# Patient Record
Sex: Female | Born: 1970 | Race: White | Hispanic: No | Marital: Married | Smoking: Never smoker
Health system: Southern US, Community
[De-identification: ages and names within clinical notes are randomized; demographics above are authoritative.]

## PROBLEM LIST (undated history)

## (undated) DIAGNOSIS — E119 Type 2 diabetes mellitus without complications: Secondary | ICD-10-CM

## (undated) DIAGNOSIS — A419 Sepsis, unspecified organism: Secondary | ICD-10-CM

## (undated) DIAGNOSIS — T1491XA Suicide attempt, initial encounter: Secondary | ICD-10-CM

## (undated) DIAGNOSIS — I10 Essential (primary) hypertension: Secondary | ICD-10-CM

## (undated) DIAGNOSIS — E7800 Pure hypercholesterolemia, unspecified: Secondary | ICD-10-CM

## (undated) HISTORY — PX: COLONOSCOPY WITH PROPOFOL: SHX5780

## (undated) HISTORY — PX: TUBAL LIGATION: SHX77

---

## 2007-03-20 ENCOUNTER — Ambulatory Visit: Payer: Self-pay | Admitting: Internal Medicine

## 2007-10-16 ENCOUNTER — Ambulatory Visit: Payer: Self-pay | Admitting: Obstetrics and Gynecology

## 2011-03-10 ENCOUNTER — Ambulatory Visit: Payer: Self-pay | Admitting: Gastroenterology

## 2015-05-09 ENCOUNTER — Emergency Department: Payer: BLUE CROSS/BLUE SHIELD

## 2015-05-09 ENCOUNTER — Encounter: Payer: Self-pay | Admitting: Emergency Medicine

## 2015-05-09 DIAGNOSIS — T148 Other injury of unspecified body region: Secondary | ICD-10-CM | POA: Diagnosis not present

## 2015-05-09 DIAGNOSIS — Y9289 Other specified places as the place of occurrence of the external cause: Secondary | ICD-10-CM | POA: Diagnosis not present

## 2015-05-09 DIAGNOSIS — S8991XA Unspecified injury of right lower leg, initial encounter: Secondary | ICD-10-CM | POA: Diagnosis not present

## 2015-05-09 DIAGNOSIS — S8992XA Unspecified injury of left lower leg, initial encounter: Secondary | ICD-10-CM | POA: Insufficient documentation

## 2015-05-09 DIAGNOSIS — Y9301 Activity, walking, marching and hiking: Secondary | ICD-10-CM | POA: Diagnosis not present

## 2015-05-09 DIAGNOSIS — W108XXA Fall (on) (from) other stairs and steps, initial encounter: Secondary | ICD-10-CM | POA: Diagnosis not present

## 2015-05-09 DIAGNOSIS — Y998 Other external cause status: Secondary | ICD-10-CM | POA: Insufficient documentation

## 2015-05-09 NOTE — ED Notes (Addendum)
pt reports falling down 2 steps landing on both knees, reports bilat knee pain. Took extra strength tylenol at approx 2145

## 2015-05-10 ENCOUNTER — Emergency Department
Admission: EM | Admit: 2015-05-10 | Discharge: 2015-05-10 | Disposition: A | Discharge status: Home / Self Care | Payer: BLUE CROSS/BLUE SHIELD | Attending: Emergency Medicine | Admitting: Emergency Medicine

## 2015-05-10 DIAGNOSIS — M25561 Pain in right knee: Secondary | ICD-10-CM

## 2015-05-10 DIAGNOSIS — M25562 Pain in left knee: Secondary | ICD-10-CM

## 2015-05-10 DIAGNOSIS — T07XXXA Unspecified multiple injuries, initial encounter: Secondary | ICD-10-CM

## 2015-05-10 MED ORDER — OXYCODONE-ACETAMINOPHEN 5-325 MG PO TABS
2.0000 | ORAL_TABLET | Freq: Once | ORAL | Status: AC
  Administered 2015-05-10: 2 via ORAL
  Filled 2015-05-10: qty 2

## 2015-05-10 MED ORDER — OXYCODONE-ACETAMINOPHEN 5-325 MG PO TABS: 1.0000 | ORAL_TABLET | Freq: Four times a day (QID) | ORAL | Status: AC | PRN

## 2015-05-10 NOTE — Discharge Instructions (Signed)

## 2015-05-10 NOTE — ED Provider Notes (Signed)
[facility] Emergency Department Provider Note  Time seen: 2:34 AM  I have reviewed the triage vital signs and the nursing notes.   HISTORY  Chief Complaint Fall    HPI Cathy Lopez is a 44 y.o. female with no past medical history who presents the emergency department with bilateral knee pain. According to the patient she was walking down steps, she thought she was on the last episode had one more to go. She fell forwards landing on her bilateral knees. States immediate pain to both knees with mild swelling. Patient still able to ambulate well husband insisted that she come get checked out per patient. Denies hitting her head, denies loss of consciousness. Denies any other injuries. Describes her pain as moderate.Dull/aching, located in bilateral knees.     History reviewed. No pertinent past medical history.  There are no active problems to display for this patient.   Past Surgical History  Procedure Laterality Date  . Tubal ligation      No current outpatient prescriptions on file.  Allergies Review of patient's allergies indicates no known allergies.  History reviewed. No pertinent family history.  Social History Social History  Substance Use Topics  . Smoking status: Never Smoker   . Smokeless tobacco: None  . Alcohol Use: Yes     Comment: social     Review of Systems Constitutional: Negative for fever. Cardiovascular: Negative for chest pain. Respiratory: Negative for shortness of breath. Gastrointestinal: Negative for abdominal pain Musculoskeletal: Bilateral knee pain Neurological: Negative for headache 10-point ROS otherwise negative.  ____________________________________________   PHYSICAL EXAM:  VITAL SIGNS: ED Triage Vitals  Enc Vitals Group     BP 05/09/15 2251 143/74 mmHg     Pulse Rate 05/09/15 2251 96     Resp 05/09/15 2251 18     Temp 05/09/15 2251 97.7 F (36.5 C)     Temp Source 05/09/15 2251 Oral   SpO2 05/09/15 2251 100 %     Weight 05/09/15 2251 155 lb (70.308 kg)     Height 05/09/15 2251 5\' 2"  (1.575 m)     Head Cir --      Peak Flow --      Pain Score 05/09/15 2255 5     Pain Loc --      Pain Edu? --      Excl. in [place]? --     Constitutional: Alert and oriented. Well appearing and in no distress. Eyes: Normal exam ENT   Head: Normocephalic and atraumatic   Mouth/Throat: Mucous membranes are moist. Cardiovascular: Normal rate, regular rhythm. No murmur Respiratory: Normal respiratory effort without tachypnea nor retractions. Breath sounds are clear  Gastrointestinal: Soft and nontender. No distention.   Musculoskeletal: Bilateral knee tenderness to palpation. Neurovascular intact distally. Patient able to ambulate. Neurologic:  Normal speech and language. No gross focal neurologic deficits  Skin:  Skin is warm, dry and intact.  Psychiatric: Mood and affect are normal. Speech and behavior are normal.   ____________________________________________   RADIOLOGY  Bilateral knee x-rays negative  ____________________________________________    INITIAL IMPRESSION / ASSESSMENT AND PLAN / ED COURSE  Pertinent labs & imaging results that were available during my care of the patient were reviewed by me and considered in my medical decision making (see chart for details).  Patient with bilateral knee pain after falling on her knees. X-rays are negative we'll discharge the patient home with Percocet for pain control and follow-up with orthopedics if not better in 5-7  days. Patient agreeable to plan.  ____________________________________________   FINAL CLINICAL IMPRESSION(S) / ED DIAGNOSES  Contusions   Harvest Dark, MD 05/10/15 [phone]

## 2015-05-10 NOTE — ED Notes (Signed)
Pt sitting in chair in exam room with no distress noted; ace wraps in place to knees bilat. Pt reports PTA was walking down steps carrying presents, missing 1 step and fell on knees; denies any other c/o or injuries

## 2015-11-18 ENCOUNTER — Emergency Department
Admission: EM | Admit: 2015-11-18 | Discharge: 2015-11-19 | Disposition: A | Discharge status: Other Acute Inpatient Hospital | Payer: BLUE CROSS/BLUE SHIELD | Attending: Emergency Medicine | Admitting: Emergency Medicine

## 2015-11-18 ENCOUNTER — Emergency Department: Payer: BLUE CROSS/BLUE SHIELD

## 2015-11-18 ENCOUNTER — Encounter: Payer: Self-pay | Admitting: Emergency Medicine

## 2015-11-18 DIAGNOSIS — Z5181 Encounter for therapeutic drug level monitoring: Secondary | ICD-10-CM | POA: Insufficient documentation

## 2015-11-18 DIAGNOSIS — R4020 Unspecified coma: Secondary | ICD-10-CM | POA: Insufficient documentation

## 2015-11-18 DIAGNOSIS — T391X2A Poisoning by 4-Aminophenol derivatives, intentional self-harm, initial encounter: Secondary | ICD-10-CM | POA: Diagnosis present

## 2015-11-18 LAB — URINE DRUG SCREEN, QUALITATIVE (ARMC ONLY)
AMPHETAMINES, UR SCREEN: NOT DETECTED
BARBITURATES, UR SCREEN: NOT DETECTED
Benzodiazepine, Ur Scrn: NOT DETECTED
COCAINE METABOLITE, UR ~~LOC~~: NOT DETECTED
Cannabinoid 50 Ng, Ur ~~LOC~~: NOT DETECTED
MDMA (ECSTASY) UR SCREEN: NOT DETECTED
METHADONE SCREEN, URINE: NOT DETECTED
Opiate, Ur Screen: NOT DETECTED
Phencyclidine (PCP) Ur S: NOT DETECTED
Tricyclic, Ur Screen: NOT DETECTED

## 2015-11-18 LAB — BLOOD GAS, ARTERIAL
ACID-BASE DEFICIT: 27.6 mmol/L — AB (ref 0.0–2.0)
Allens test (pass/fail): POSITIVE — AB
BICARBONATE: 3.9 meq/L — AB (ref 21.0–28.0)
FIO2: 0.5
LHR: 16 {breaths}/min
MECHVT: 500 mL
Mechanical Rate: 16
O2 Saturation: 99.7 %
PATIENT TEMPERATURE: 37
PCO2 ART: 19 mmHg — AB (ref 32.0–48.0)
PEEP/CPAP: 5 cmH2O
PH ART: 6.92 — AB (ref 7.350–7.450)
PO2 ART: 285 mmHg — AB (ref 83.0–108.0)

## 2015-11-18 LAB — URINALYSIS COMPLETE WITH MICROSCOPIC (ARMC ONLY)
BILIRUBIN URINE: NEGATIVE
GLUCOSE, UA: NEGATIVE mg/dL
Hgb urine dipstick: NEGATIVE
LEUKOCYTES UA: NEGATIVE
NITRITE: NEGATIVE
PH: 5 (ref 5.0–8.0)
Protein, ur: 100 mg/dL — AB
Specific Gravity, Urine: 1.019 (ref 1.005–1.030)

## 2015-11-18 LAB — PROTIME-INR
INR: 2.03
Prothrombin Time: 22.8 seconds — ABNORMAL HIGH (ref 11.4–15.0)

## 2015-11-18 LAB — COMPREHENSIVE METABOLIC PANEL
ALBUMIN: 4 g/dL (ref 3.5–5.0)
ALK PHOS: 57 U/L (ref 38–126)
ALT: 197 U/L — AB (ref 14–54)
AST: 199 U/L — ABNORMAL HIGH (ref 15–41)
BUN: 19 mg/dL (ref 6–20)
CALCIUM: 9.2 mg/dL (ref 8.9–10.3)
CHLORIDE: 102 mmol/L (ref 101–111)
CREATININE: 1.8 mg/dL — AB (ref 0.44–1.00)
GFR calc Af Amer: 39 mL/min — ABNORMAL LOW (ref >60)
GFR calc non Af Amer: 33 mL/min — ABNORMAL LOW (ref >60)
GLUCOSE: 149 mg/dL — AB (ref 65–99)
Potassium: 3.6 mmol/L (ref 3.5–5.1)
SODIUM: 152 mmol/L — AB (ref 135–145)
Total Bilirubin: 2.4 mg/dL — ABNORMAL HIGH (ref 0.3–1.2)
Total Protein: 6.5 g/dL (ref 6.5–8.1)

## 2015-11-18 LAB — CBC
HCT: 51.9 % — ABNORMAL HIGH (ref 35.0–47.0)
HEMOGLOBIN: 15.8 g/dL (ref 12.0–16.0)
MCH: 30 pg (ref 26.0–34.0)
MCHC: 30.5 g/dL — ABNORMAL LOW (ref 32.0–36.0)
MCV: 98.1 fL (ref 80.0–100.0)
Platelets: 314 10*3/uL (ref 150–440)
RBC: 5.28 MIL/uL — AB (ref 3.80–5.20)
RDW: 14.7 % — ABNORMAL HIGH (ref 11.5–14.5)
WBC: 26.4 10*3/uL — AB (ref 3.6–11.0)

## 2015-11-18 LAB — LACTIC ACID, PLASMA: Lactic Acid, Venous: 17.1 mmol/L (ref 0.5–1.9)

## 2015-11-18 LAB — TROPONIN I: Troponin I: 0.13 ng/mL (ref <0.03)

## 2015-11-18 LAB — ETHANOL: Alcohol, Ethyl (B): <5 mg/dL (ref <5)

## 2015-11-18 LAB — ACETAMINOPHEN LEVEL

## 2015-11-18 LAB — SALICYLATE LEVEL: Salicylate Lvl: <4 mg/dL (ref 2.8–30.0)

## 2015-11-18 MED ORDER — SODIUM BICARBONATE 8.4 % IV SOLN
50.0000 meq | Freq: Once | INTRAVENOUS | Status: AC
  Administered 2015-11-18: 50 meq via INTRAVENOUS

## 2015-11-18 MED ORDER — ETOMIDATE 2 MG/ML IV SOLN: 20.0000 mg | Freq: Once | INTRAVENOUS | Status: DC

## 2015-11-18 MED ORDER — SUCCINYLCHOLINE CHLORIDE 20 MG/ML IJ SOLN
INTRAMUSCULAR | Status: AC | PRN
  Administered 2015-11-18: 100 mg via INTRAVENOUS

## 2015-11-18 MED ORDER — NOREPINEPHRINE 4 MG/250ML-% IV SOLN
INTRAVENOUS | Status: AC
  Filled 2015-11-18: qty 250

## 2015-11-18 MED ORDER — DEXTROSE 5 % IV SOLN
15.0000 mg/kg/h | INTRAVENOUS | Status: DC
  Administered 2015-11-18: 15 mg/kg/h via INTRAVENOUS
  Filled 2015-11-18: qty 200

## 2015-11-18 MED ORDER — VASOPRESSIN 20 UNIT/ML IV SOLN
0.0300 [IU]/min | INTRAVENOUS | Status: DC
  Administered 2015-11-19: 0.03 [IU]/min via INTRAVENOUS
  Filled 2015-11-18: qty 2

## 2015-11-18 MED ORDER — PIPERACILLIN-TAZOBACTAM 3.375 G IVPB 30 MIN
3.3750 g | Freq: Once | INTRAVENOUS | Status: AC
Start: 2015-11-18 — End: 2015-11-19
  Administered 2015-11-19: 3.375 g via INTRAVENOUS
  Filled 2015-11-18: qty 50

## 2015-11-18 MED ORDER — NOREPINEPHRINE 4 MG/250ML-% IV SOLN
0.0000 ug/min | INTRAVENOUS | Status: DC
  Administered 2015-11-19: 24 ug/min via INTRAVENOUS
  Filled 2015-11-18: qty 250

## 2015-11-18 MED ORDER — SODIUM BICARBONATE 8.4 % IV SOLN
50.0000 meq | Freq: Once | INTRAVENOUS | Status: AC
  Administered 2015-11-18: 50 meq via INTRAVENOUS
  Filled 2015-11-18: qty 50

## 2015-11-18 MED ORDER — ACETYLCYSTEINE LOAD VIA INFUSION
150.0000 mg/kg | Freq: Once | INTRAVENOUS | Status: DC
Start: 2015-11-18 — End: 2015-11-18
  Filled 2015-11-18 (×2): qty 330

## 2015-11-18 MED ORDER — SODIUM CHLORIDE 0.9 % IV BOLUS (SEPSIS)
1000.0000 mL | Freq: Once | INTRAVENOUS | Status: AC
  Administered 2015-11-18: 1000 mL via INTRAVENOUS

## 2015-11-18 MED ORDER — PROPOFOL 1000 MG/100ML IV EMUL
INTRAVENOUS | Status: AC | PRN
  Administered 2015-11-18: 9.48 ug/kg/min via INTRAVENOUS

## 2015-11-18 MED ORDER — PROPOFOL 1000 MG/100ML IV EMUL
INTRAVENOUS | Status: AC
  Filled 2015-11-18: qty 100

## 2015-11-18 MED ORDER — ETOMIDATE 2 MG/ML IV SOLN
INTRAVENOUS | Status: AC | PRN
  Administered 2015-11-18: 20 mg via INTRAVENOUS

## 2015-11-18 MED ORDER — SODIUM BICARBONATE 8.4 % IV SOLN
INTRAVENOUS | Status: AC
  Administered 2015-11-18: 50 meq via INTRAVENOUS
  Filled 2015-11-18: qty 50

## 2015-11-18 MED ORDER — NOREPINEPHRINE BITARTRATE 1 MG/ML IV SOLN
10.0000 ug/min | Freq: Once | INTRAVENOUS | Status: DC
  Administered 2015-11-18: 10 ug/min via INTRAVENOUS

## 2015-11-18 MED ORDER — VANCOMYCIN HCL IN DEXTROSE 1-5 GM/200ML-% IV SOLN
1000.0000 mg | Freq: Once | INTRAVENOUS | Status: AC
  Administered 2015-11-19: 1000 mg via INTRAVENOUS
  Filled 2015-11-18: qty 200

## 2015-11-18 MED ORDER — PROPOFOL 1000 MG/100ML IV EMUL
5.0000 ug/kg/min | INTRAVENOUS | Status: DC
  Administered 2015-11-18: 9.48 ug/kg/min via INTRAVENOUS
  Filled 2015-11-18: qty 100

## 2015-11-18 MED ORDER — ACETYLCYSTEINE LOAD VIA INFUSION
150.0000 mg/kg | Freq: Once | INTRAVENOUS | Status: AC
  Administered 2015-11-18: 13185 mg via INTRAVENOUS
  Filled 2015-11-18: qty 330

## 2015-11-18 MED ORDER — SODIUM BICARBONATE 8.4 % IV SOLN
INTRAVENOUS | Status: DC
  Administered 2015-11-18: 21:00:00 via INTRAVENOUS
  Filled 2015-11-18 (×4): qty 850

## 2015-11-18 MED ORDER — CEFTRIAXONE SODIUM 1 G IJ SOLR
1.0000 g | Freq: Once | INTRAMUSCULAR | Status: AC
  Administered 2015-11-18: 1 g via INTRAVENOUS
  Filled 2015-11-18: qty 10

## 2015-11-18 MED ORDER — SUCCINYLCHOLINE CHLORIDE 20 MG/ML IJ SOLN: 100.0000 mg | Freq: Once | INTRAMUSCULAR | Status: DC

## 2015-11-18 NOTE — ED Notes (Addendum)
Patients BP slowly decreasing, MD notified and gave verbal order to decrease propofol to 2.5 ml/hr.  See [facility]

## 2015-11-18 NOTE — Progress Notes (Addendum)
ETT pulled 1cm from 23cm to 22cm

## 2015-11-18 NOTE — ED Notes (Addendum)
Pt comes into the ED via EMS as emergency traffic for intentional overdose on tylenol.  Patient found by husband in the bathtub with a note and an empty bottle of tylenol (100 tablets at 500 mg a tablet only 1 tablet left in the bottle when found)  next to her.  Given 2 of narcan in route and 500 ml bolus.  No past medical history but patient's family stated that she has been feeling ill lately.  No history of SI.  EMS was called by family at 17:49 but patient was down for unknown amount of time.

## 2015-11-18 NOTE — ED Provider Notes (Addendum)
[facility] Emergency Department Provider Note  Time seen: 6:51 PM  I have reviewed the triage vital signs and the nursing notes.   HISTORY  Chief Complaint Drug Overdose    HPI Cathy Lopez is a 45 y.o. female with no known past medical history who presents the emergency department unresponsive by EMS. According to EMS the husband of the patient returned home to find her in the bathtub unresponsive with a suicide note and an empty bottle of Tylenol. It is not clear what time the patient took the pills. Upon arrival to the emergency department and the patient is moaning, unresponsive to commands, no reaction to sternal rub. Patient has dried vomitus on her face. Patient unable to answer questions. Patient's eyes are open, but she does not make eye contact. Hypotensive 90/50 upon arrival.     History reviewed. No pertinent past medical history.  There are no active problems to display for this patient.   Past Surgical History  Procedure Laterality Date  . Tubal ligation      Current Outpatient Rx  Name  Route  Sig  Dispense  Refill  . oxyCODONE-acetaminophen (ROXICET) 5-325 MG tablet   Oral   Take 1 tablet by mouth every 6 (six) hours as needed.   12 tablet   0     Allergies Review of patient's allergies indicates no known allergies.  No family history on file.  Social History Social History  Substance Use Topics  . Smoking status: Never Smoker   . Smokeless tobacco: None  . Alcohol Use: Yes     Comment: social     Review of Systems Unable to obtain a review of systems secondary to unresponsiveness. ____________________________________________   PHYSICAL EXAM:  VITAL SIGNS: ED Triage Vitals  Enc Vitals Group     BP 11/18/15 1836 90/50 mmHg     Pulse Rate 11/18/15 1834 83     Resp 11/18/15 1834 22     Temp --      Temp src --      SpO2 11/18/15 1834 97 %     Weight 11/18/15 1834 193 lb 12.6 oz (87.9 kg)     Height --       Head Cir --      Peak Flow --      Pain Score --      Pain Loc --      Pain Edu? --      Excl. in [place]? --     Constitutional:Eyes are open, but she does not make eye contact, moaning, but no reaction to sternal rub. Dried vomitus on face. Eyes: 3-4 millimeters, sluggishly reactive bilaterally. ENT   Head: Normocephalic and atraumatic.   Mouth/Throat: Dry mucous membranes with dried vomitus on face. Cardiovascular: Normal rate, regular rhythm. No murmur Respiratory: Normal respiratory effort without tachypnea nor retractions. Breath sounds are clear  Gastrointestinal: No distention. Soft. Musculoskeletal: Atraumatic appearing extremities. No lower extremity edema. Neurologic:  Unresponsive to painful stimuli. Skin:  Skin is warm, dry  Psychiatric: Unresponsive.  ____________________________________________    EKG  EKG reviewed and interpreted by myself shows normal sinus rhythm at 83 bpm, slightly widened QRS, normal axis, prolonged QTc interval 592. Nonspecific ST changes.  ____________________________________________    RADIOLOGY  Chest x-ray negative.   INITIAL IMPRESSION / ASSESSMENT AND PLAN / ED COURSE  Pertinent labs & imaging results that were available during my care of the patient were reviewed by me and considered in my  medical decision making (see chart for details).  Patient presents the emergency department largely unresponsive, no reaction to painful stimuli with dried vomitus on face. At that time decision was made to intubate for airway protection. Intubation performed. Patient found with a suicide note per EMS and an empty bottle of Tylenol. Does not clear with time the patient took the Tylenol, or the patient took any additional substances. We will IV hydrate as the patient is currently hypotensive 90/50. Labs have been sent. I discussed with pharmacy and they will be sending acetylcysteine for IV dosing. ----------------------------------------- 8:54  PM on 11/18/2015 -----------------------------------------  Patient's labs have resulted with an elevated acetaminophen level greater than 500. INR 2.03, leukocytosis of 26,000, ALT of 197. Troponin 0.13. Initially the ALT was under manual dilution, I spoke to the lab regarding thisand that means it will be significantly elevated. It is finally resulted but only at 197. Patient does have a pH of 6.7. Patient has been dosed an amp of sodium bicarbonate in it now on a sodium bicarbonate drip. Acetaminophen greater and 500 patient is on acetylcysteine drip. Patient's blood pressure remains extremely low currently 74 systolic. I placed a right femoral central line and started patient on norepinephrine. Patient is currently on propofol at 2 mcg/kg/m with adequate sedation. Patient has been accepted to [facility] for transfer to the ICU given the likelihood of liver failure with a significantly elevated acetaminophen level is significantly lower pH. I discussed this with our hospitalist who felt the patient would be better cared for at a Center capable of performing a liver transplant if deemed necessary. Currently awaiting bed availability at [facility] ICU.  I have canceled the patient's involuntary commitment so she can be transferred via EMS. Patient is currently intubated and sedated and does not pose a threat to herself or anyone else in her current state.  ----------------------------------------- 10:16 PM on 11/18/2015 -----------------------------------------  Repeat ABG shows a pH of 6.85. We will dose of an additional amp of sodium bicarbonate, increased a sodium bicarbonate drip to 250 cc per hour.   ----------------------------------------- 10:51 PM on 11/18/2015 -----------------------------------------  Patient has received a bed at [facility] intensive care unit, we are currently arranging transportation. Patient's blood pressure currently 91/47 on levothyroid. Patient is on a bicarbonate drip as  well as acetylcysteine drip. Patient has a triple lumen right femoral catheter.  CT head is negative. Lactate has resulted at 17.  ----------------------------------------- 11:25 PM on 11/18/2015 -----------------------------------------  [facility] intensive is updated on patient condition, and lab results. Given her elevated lactic acidosis continued hypotension [facility] intensive this would like the patient started on vasopressin, blood cultures sent and started on IV antibiotics. We will start the patient on these medications while awaiting transport to [facility].     INTUBATION Performed by: Harvest Dark  Required items: required blood products, implants, devices, and special equipment available Patient identity confirmed: provided demographic data and hospital-assigned identification number Time out: Immediately prior to procedure a "time out" was called to verify the correct patient, procedure, equipment, support staff and site/side marked as required.  Indications: airway protection  Intubation method: 4.0 Glidescope Laryngoscopy   Preoxygenation: 100% O2 BVM  Sedatives: 20mg  Etomidate Paralytic: 100mg  Succinylcholine  Tube Size: 7.5 cuffed  Post-procedure assessment: chest rise and ETCO2 monitor Breath sounds: equal and absent over the epigastrium Tube secured with: ETT holder Chest x-ray interpreted by radiologist and me.  Chest x-ray findings: endotracheal tube in appropriate position  Patient tolerated the procedure well with no  immediate complications.  CENTRAL LINE Performed by: Harvest Dark Consent: The procedure was performed in an emergent situation. Required items: required blood products, implants, devices, and special equipment available Patient identity confirmed: arm band and provided demographic data Time out: Immediately prior to procedure a "time out" was called to verify the correct patient, procedure, equipment, support staff and site/side marked as  required. Indications: vascular access Anesthesia: local infiltration Local anesthetic: lidocaine 1% with epinephrine Anesthetic total: 3 ml Patient sedated: no Preparation: skin prepped with 2% chlorhexidine Skin prep agent dried: skin prep agent completely dried prior to procedure Sterile barriers: all five maximum sterile barriers used - cap, mask, sterile gown, sterile gloves, and large sterile sheet Hand hygiene: hand hygiene performed prior to central venous catheter insertion  Location details: right femoral  Catheter type: triple lumen Catheter size: 8 Fr Pre-procedure: landmarks identified Ultrasound guidance: no Successful placement: yes Post-procedure: line sutured and dressing applied Assessment: blood return through all parts, free fluid flow Patient tolerance: Patient tolerated the procedure well with no immediate complications.    CRITICAL CARE Performed by: Harvest Dark   Total critical care time: 120 minutes  Critical care time was exclusive of separately billable procedures and treating other patients.  Critical care was necessary to treat or prevent imminent or life-threatening deterioration.  Critical care was time spent personally by me on the following activities: development of treatment plan with patient and/or surrogate as well as nursing, discussions with consultants, evaluation of patient's response to treatment, examination of patient, obtaining history from patient or surrogate, ordering and performing treatments and interventions, ordering and review of laboratory studies, ordering and review of radiographic studies, pulse oximetry and re-evaluation of patient's condition.    ____________________________________________   FINAL CLINICAL IMPRESSION(S) / ED DIAGNOSES  Unresponsiveness Overdose   Harvest Dark, MD 11/18/15 2252  Harvest Dark, MD 11/18/15 2252  Harvest Dark, MD 11/18/15 2325  Harvest Dark,  MD 11/18/15 2329

## 2015-11-18 NOTE — Code Documentation (Signed)
Radiology at bedside for chest x ray

## 2015-11-18 NOTE — Progress Notes (Signed)
MEDICATION RELATED CONSULT NOTE - INITIAL   Pharmacy Consult for acetaminophen overdose treatment using N-Acetylcysteine (NAC) Indication: acetaminophen overdose  No Known Allergies  Patient Measurements: Weight: 193 lb 12.6 oz (87.9 kg)   Vital Signs: BP: 106/57 mmHg (07/06 1915) Pulse Rate: 79 (07/06 1915) Intake/Output from previous day:   Intake/Output from this shift:    Labs:  Recent Labs  11/18/15 1839  WBC 26.4*  HGB 15.8  HCT 51.9*  PLT 314   CrCl cannot be calculated (Unknown ideal weight.).  Assessment: Patient found unresponsive by husband with empty bottle of tylenol and suicide note. Unknown time of ingestion. Contacted by MD for IV NAC Discussed with [facility], recommended to start IV NAC now due to unknown time of ingestion.   Plan:  Baseline labs ordered. Will order NAC load and infusion. Recheck labs 22 hours into maintenance therapy and follow up with poison control.   Cathy Lopez C 11/18/2015,7:32 PM     [place]

## 2015-11-18 NOTE — ED Notes (Signed)
MD at bedside with family

## 2015-11-18 NOTE — ED Notes (Addendum)
Poison control called to be updated on patients care, lab work, and vital signs.  Spoke with Colletta Maryland with [employer] for all updated information.

## 2015-11-18 NOTE — Progress Notes (Signed)
Pharmacy Progress note  IV acetylcysteine started at 2006. Follow up labs (AST/ALT, INR, BMET, acetaminophen level) ordered for 22h after start of drip for 7/7 (tomorrow) at 1800.

## 2015-11-18 NOTE — ED Notes (Signed)
[place] with poison control called to give update on patient.  EKG information given to [facility] and recommendations include: draw magnesium and potassium levels then supplement to high-normal levels and repeat the EKG after this has been done.

## 2015-11-18 NOTE — ED Notes (Signed)
Per MD verbal order, bump sodium bicarb to 250mg /hr and give another amp of sodium bicarb.  See [facility]

## 2015-11-18 NOTE — Progress Notes (Signed)
Transported pt to CT and back to ED on the vent without incident.

## 2015-11-18 NOTE — ED Notes (Signed)
Pharmacy called poison control

## 2015-11-18 NOTE — ED Notes (Signed)
Patients husband is at bedside with patient.  Given update on care of patient.

## 2015-11-19 NOTE — ED Notes (Signed)
1 bottle of propofol sent with air care.

## 2015-11-20 LAB — URINE CULTURE: Culture: 1000 — AB

## 2015-11-24 LAB — CULTURE, BLOOD (ROUTINE X 2)
CULTURE: NO GROWTH
CULTURE: NO GROWTH

## 2015-11-25 LAB — BLOOD GAS, ARTERIAL
ALLENS TEST (PASS/FAIL): POSITIVE — AB
Allens test (pass/fail): POSITIVE — AB
FIO2: 1
FIO2: 0.5
Mechanical Rate: 16
Mechanical Rate: 16
PATIENT TEMPERATURE: 37
PEEP: 5 cmH2O
PEEP: 5 cmH2O
Patient temperature: 37
RATE: 16 resp/min
RATE: 16 resp/min
VT: 500 mL
VT: 500 mL
pCO2 arterial: 24 mmHg — ABNORMAL LOW (ref 32.0–48.0)
pCO2 arterial: 17 mmHg — CL (ref 32.0–48.0)
pH, Arterial: 6.7 — CL (ref 7.350–7.450)
pH, Arterial: 6.85 — CL (ref 7.350–7.450)
pO2, Arterial: >552 mmHg — ABNORMAL HIGH (ref 83.0–108.0)
pO2, Arterial: 288 mmHg — ABNORMAL HIGH (ref 83.0–108.0)

## 2016-10-25 ENCOUNTER — Emergency Department: Payer: BLUE CROSS/BLUE SHIELD

## 2016-10-25 ENCOUNTER — Encounter: Payer: Self-pay | Admitting: *Deleted

## 2016-10-25 ENCOUNTER — Emergency Department
Admission: EM | Admit: 2016-10-25 | Discharge: 2016-10-25 | Disposition: A | Discharge status: Home / Self Care | Payer: BLUE CROSS/BLUE SHIELD | Attending: Emergency Medicine | Admitting: Emergency Medicine

## 2016-10-25 DIAGNOSIS — R079 Chest pain, unspecified: Secondary | ICD-10-CM

## 2016-10-25 DIAGNOSIS — Z8659 Personal history of other mental and behavioral disorders: Secondary | ICD-10-CM | POA: Diagnosis not present

## 2016-10-25 DIAGNOSIS — R1111 Vomiting without nausea: Secondary | ICD-10-CM | POA: Diagnosis not present

## 2016-10-25 DIAGNOSIS — R0602 Shortness of breath: Secondary | ICD-10-CM | POA: Insufficient documentation

## 2016-10-25 DIAGNOSIS — R61 Generalized hyperhidrosis: Secondary | ICD-10-CM | POA: Diagnosis not present

## 2016-10-25 DIAGNOSIS — R0789 Other chest pain: Secondary | ICD-10-CM | POA: Diagnosis present

## 2016-10-25 HISTORY — DX: Suicide attempt, initial encounter: T14.91XA

## 2016-10-25 LAB — BASIC METABOLIC PANEL
ANION GAP: 8 (ref 5–15)
BUN: 12 mg/dL (ref 6–20)
CHLORIDE: 104 mmol/L (ref 101–111)
CO2: 24 mmol/L (ref 22–32)
Calcium: 9.6 mg/dL (ref 8.9–10.3)
Creatinine, Ser: 0.65 mg/dL (ref 0.44–1.00)
GFR calc Af Amer: >60 mL/min (ref >60)
GLUCOSE: 102 mg/dL — AB (ref 65–99)
POTASSIUM: 4.9 mmol/L (ref 3.5–5.1)
Sodium: 136 mmol/L (ref 135–145)

## 2016-10-25 LAB — CBC
HEMATOCRIT: 44.7 % (ref 35.0–47.0)
HEMOGLOBIN: 15.4 g/dL (ref 12.0–16.0)
MCH: 29.4 pg (ref 26.0–34.0)
MCHC: 34.4 g/dL (ref 32.0–36.0)
MCV: 85.6 fL (ref 80.0–100.0)
Platelets: 264 10*3/uL (ref 150–440)
RBC: 5.22 MIL/uL — ABNORMAL HIGH (ref 3.80–5.20)
RDW: 13.7 % (ref 11.5–14.5)
WBC: 9.3 10*3/uL (ref 3.6–11.0)

## 2016-10-25 LAB — POCT PREGNANCY, URINE: Preg Test, Ur: NEGATIVE

## 2016-10-25 LAB — HEPATIC FUNCTION PANEL
ALBUMIN: 4.3 g/dL (ref 3.5–5.0)
ALT: 20 U/L (ref 14–54)
AST: 15 U/L (ref 15–41)
Alkaline Phosphatase: 50 U/L (ref 38–126)
BILIRUBIN TOTAL: 0.5 mg/dL (ref 0.3–1.2)
Total Protein: 7.3 g/dL (ref 6.5–8.1)

## 2016-10-25 LAB — TROPONIN I: Troponin I: <0.03 ng/mL (ref <0.03)

## 2016-10-25 MED ORDER — ASPIRIN 81 MG PO CHEW
324.0000 mg | CHEWABLE_TABLET | Freq: Once | ORAL | Status: AC
  Administered 2016-10-25: 324 mg via ORAL
  Filled 2016-10-25: qty 4

## 2016-10-25 NOTE — ED Provider Notes (Signed)
[facility] Emergency Department Provider Note  ____________________________________________  Time seen: Approximately 12:39 PM  I have reviewed the triage vital signs and the nursing notes.   HISTORY  Chief Complaint Chest Pain   HPI Cathy Lopez is a 46 y.o. female with a history of depression and prior suicide attempt a year ago by Tylenol overdose who presents for evaluation of chest pain. Patient reports that she started having central chest heaviness that began at 2 AM. The episodes last a few minutes at a time and were associated with nausea, a few episodes of nonbloody nonbilious emesis, shortness of breath, and diaphoresis. Patient has never had similar episodes in the past. No history of smoking or drug use. She has family history and her mother of a heart attack. Patient has no pain at this time. Her last episode was at 10 AM which prompted her visit to the emergency room. No abdominal pain, no cough or congestion, no shortness of breath or chest pain at this time, no fever or chills, no hematuria, no diarrhea or constipation. Patient has never seen a cardiologist or had a stress test in her life. No personal or family history of blood clots, no recent travel or immobilization, no leg pain or swelling, no hemoptysis, no hormone use.  Past Medical History:  Diagnosis Date  . Suicide attempt ([place])     There are no active problems to display for this patient.   Past Surgical History:  Procedure Laterality Date  . TUBAL LIGATION      Prior to Admission medications   Medication Sig Start Date End Date Taking? Authorizing Provider  Melatonin 5 MG TABS Take 5 mg by mouth at bedtime as needed.   Yes [provider]  Multiple Vitamin (MULTIVITAMIN) tablet Take 1 tablet by mouth daily.   Yes [provider]  sertraline (ZOLOFT) 50 MG tablet Take 50 mg by mouth daily. 09/07/16  Yes [provider]  oxyCODONE-acetaminophen  (ROXICET) 5-325 MG tablet Take 1 tablet by mouth every 6 (six) hours as needed. Patient not taking: Reported on 10/25/2016 05/10/15   Harvest Dark, MD    Allergies Patient has no known allergies.  Family History Mother - heart attack  Social History Social History  Substance Use Topics  . Smoking status: Never Smoker  . Smokeless tobacco: Never Used  . Alcohol use Yes     Comment: social     Review of Systems  Constitutional: Negative for fever. Eyes: Negative for visual changes. ENT: Negative for sore throat. Neck: No neck pain  Cardiovascular: +chest pain, diaphoresis Respiratory: Negative for shortness of breath. Gastrointestinal: Negative for abdominal pain,  Diarrhea. + N/V Genitourinary: Negative for dysuria. Musculoskeletal: Negative for back pain. Skin: Negative for rash. Neurological: Negative for headaches, weakness or numbness. Psych: No SI or HI  ____________________________________________   PHYSICAL EXAM:  VITAL SIGNS: ED Triage Vitals  Enc Vitals Group     BP 10/25/16 1024 (!) 167/76     Pulse Rate 10/25/16 1024 96     Resp 10/25/16 1024 18     Temp 10/25/16 1024 98.4 F (36.9 C)     Temp Source 10/25/16 1024 Oral     SpO2 10/25/16 1024 99 %     Weight 10/25/16 1020 190 lb (86.2 kg)     Height 10/25/16 1020 5\' 2"  (1.575 m)     Head Circumference --      Peak Flow --      Pain  Score 10/25/16 1019 3     Pain Loc --      Pain Edu? --      Excl. in [place]? --     Constitutional: Alert and oriented. Well appearing and in no apparent distress. HEENT:      Head: Normocephalic and atraumatic.         Eyes: Conjunctivae are normal. Sclera is non-icteric.       Mouth/Throat: Mucous membranes are moist.       Neck: Supple with no signs of meningismus. Cardiovascular: Regular rate and rhythm. No murmurs, gallops, or rubs. 2+ symmetrical distal pulses are present in all extremities. No JVD. Respiratory: Normal respiratory effort. Lungs are clear to  auscultation bilaterally. No wheezes, crackles, or rhonchi.  Gastrointestinal: Soft, non tender, and non distended with positive bowel sounds. No rebound or guarding. Musculoskeletal: Nontender with normal range of motion in all extremities. No edema, cyanosis, or erythema of extremities. Neurologic: Normal speech and language. Face is symmetric. Moving all extremities. No gross focal neurologic deficits are appreciated. Skin: Skin is warm, dry and intact. No rash noted. Psychiatric: Mood and affect are normal. Speech and behavior are normal.  ____________________________________________   LABS (all labs ordered are listed, but only abnormal results are displayed)  Labs Reviewed  BASIC METABOLIC PANEL - Abnormal; Notable for the following:       Result Value   Glucose, Bld 102 (*)    All other components within normal limits  CBC - Abnormal; Notable for the following:    RBC 5.22 (*)    All other components within normal limits  HEPATIC FUNCTION PANEL - Abnormal; Notable for the following:    Bilirubin, Direct <0.1 (*)    All other components within normal limits  TROPONIN I  TROPONIN I  POCT PREGNANCY, URINE   ____________________________________________  EKG  ED ECG REPORT I, Rudene Re, the attending physician, personally viewed and interpreted this ECG.  Normal sinus rhythm, rate of 98, normal intervals, normal axis, no ST elevations or depressions. Unchanged from prior from 2017 ____________________________________________  RADIOLOGY  CXR:  No acute cardiopulmonary abnormality. Thoracic aortic atherosclerosis.  ____________________________________________   PROCEDURES  Procedure(s) performed: None Procedures Critical Care performed:  None ____________________________________________   INITIAL IMPRESSION / ASSESSMENT AND PLAN / ED COURSE  46 y.o. female with a history of depression and prior suicide attempt a year ago by Tylenol overdose who presents  for evaluation of several episodes of chest pain since 2 AM. Last episode at 10 AM.     Chest pain in a 46 y.o. female with low suspicion for cardiac (HEART score 2) or other serious etiology (including aortic dissection, pneumonia, pneumothorax, or pulmonary embolism) based her history and physical exam in the ED today. PERC negative. EKG normal. Plan for labs including CBC, chemistries and troponin now and in 3 hours, CXR and re-evaluation for disposition. Will give full dose ASA . Will observe patient on cardiac monitor while in the ED and pain control.    ----------------------------------------- 12:46 PM on 10/25/2016 -----------------------------------------   OBSERVATION CARE: This patient is being placed under observation care for the following reasons: Chest pain with repeat testing to rule out ischemia   ----------------------------------------- 13:46 PM on 10/25/2016 ----------------------------------------- Patient remains well appearing with no chest pain. Second troponin is pending.  ----------------------------------------- 3:23 PM on 10/25/2016 -----------------------------------------   END OF OBSERVATION STATUS: After an appropriate period of observation, this patient is being discharged due to the following reason(s):  Troponin 2  negative. No further episodes of chest pain in the emergency room. Patient will be discharged home on a baby aspirin and follow-up with Dr. and, cardiology. Recommended that she returns to the emergency room if she has any chest pain between now and tendon.     Pertinent labs & imaging results that were available during my care of the patient were reviewed by me and considered in my medical decision making (see chart for details).    ____________________________________________   FINAL CLINICAL IMPRESSION(S) / ED DIAGNOSES  Final diagnoses:  Chest pain, unspecified type      NEW MEDICATIONS STARTED DURING THIS VISIT:  New  Prescriptions   No medications on file     Note:  This document was prepared using Dragon voice recognition software and may include unintentional dictation errors.    Alfred Levins, [place], MD 10/25/16 [phone]

## 2016-10-25 NOTE — Discharge Instructions (Signed)

## 2016-10-25 NOTE — ED Triage Notes (Signed)
States mid center cheat heaviness that began at 2 am, states she had several episodes of vomiting with SOB and cold sweats, pt awake and alert, appears in no acute distress

## 2016-12-25 ENCOUNTER — Telehealth: Payer: Self-pay

## 2016-12-25 NOTE — Telephone Encounter (Signed)
L MOM to see if pt is seeing [facility].

## 2016-12-25 NOTE — Telephone Encounter (Signed)
-----   Message from Britt Bottom, [place] sent at 12/25/2016 10:04 AM EDT ----- Goodmorning, Pt seen in ED looks like recently f/u with [facility] cardiology.  Pt may need appointment cancelled if going to [facility].  Thank you, Lenda Kelp

## 2017-01-01 ENCOUNTER — Ambulatory Visit: Payer: BLUE CROSS/BLUE SHIELD | Admitting: Cardiovascular Disease

## 2018-03-03 IMAGING — CT CT HEAD W/O CM
3 series · 16 of 47 positions shown, 19 images · non-contrast
Comparison: None

CLINICAL DATA: Intentional overdose of Tylenol, altered mental
status

EXAM:
CT HEAD WITHOUT CONTRAST
TECHNIQUE: Contiguous axial images were obtained from the base of the skull
through the vertex without intravenous contrast.

[Series 2: head wo · axial · 0.40mm/px · z∈[-210,-85]mm · 10 of 31 slices shown, 13 images]
[im 3/31  brain]
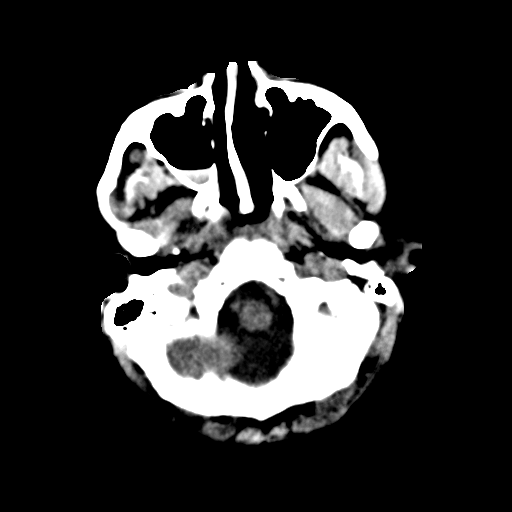
[im 3/31  bone]
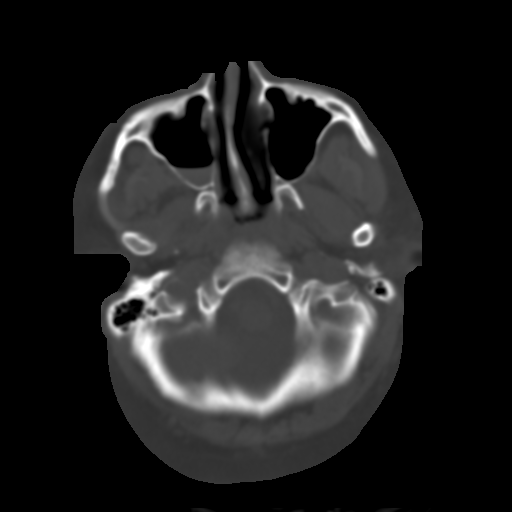
[im 6/31  brain]
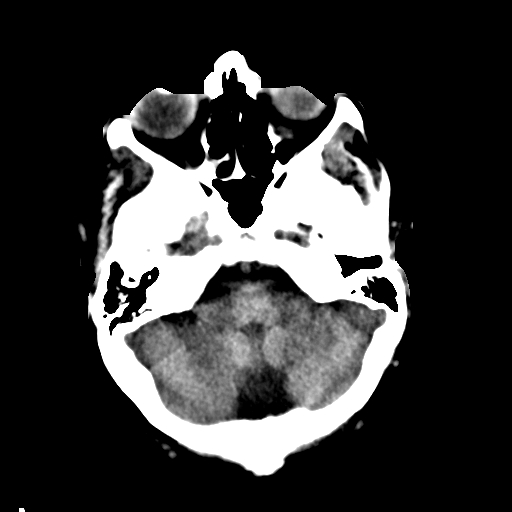
[im 9/31  brain]
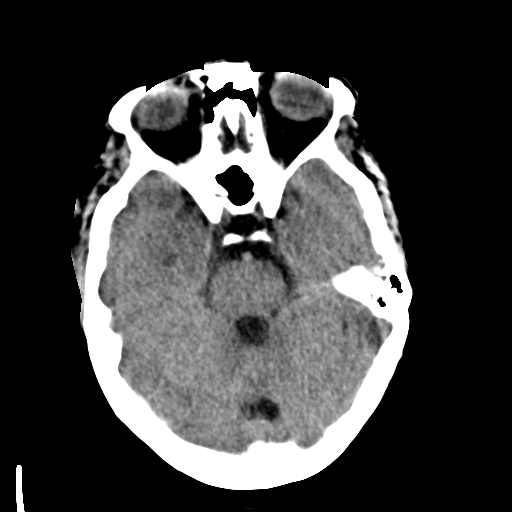
[im 11/31  brain]
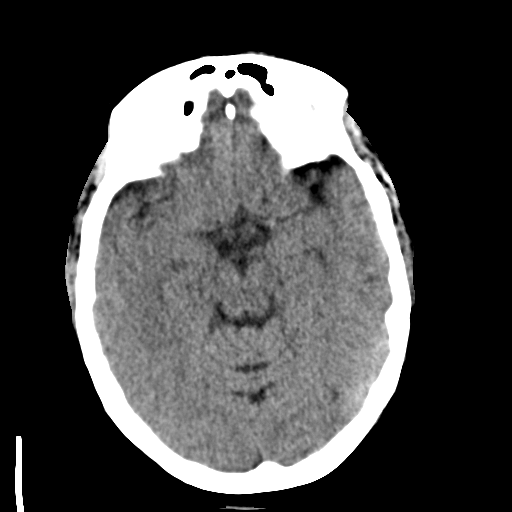
[im 14/31  brain]
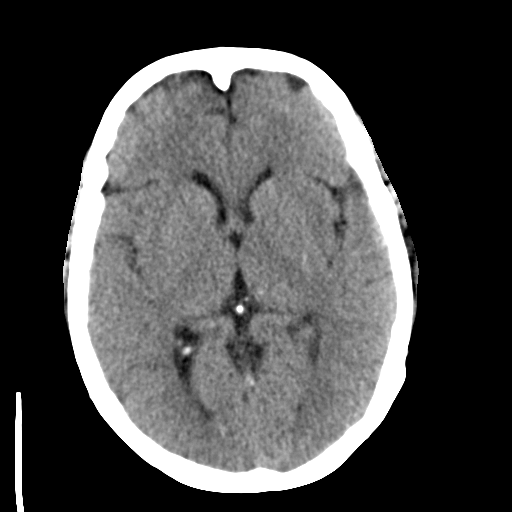
[im 14/31  bone]
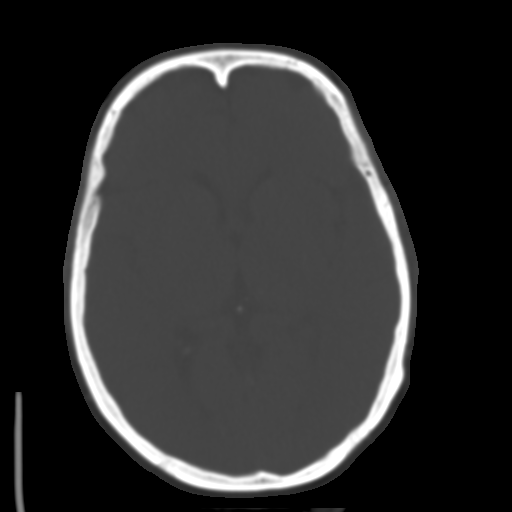
[im 17/31  brain]
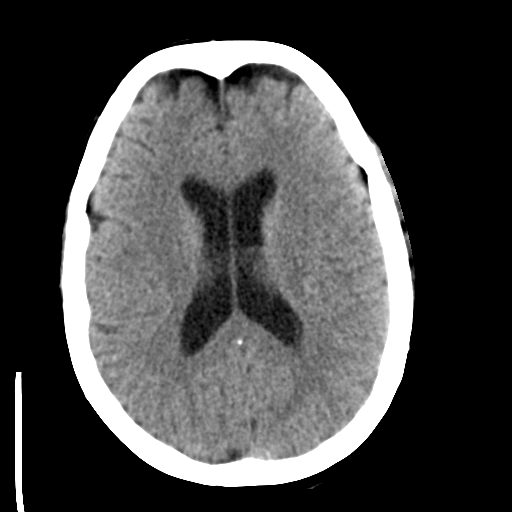
[im 20/31  brain]
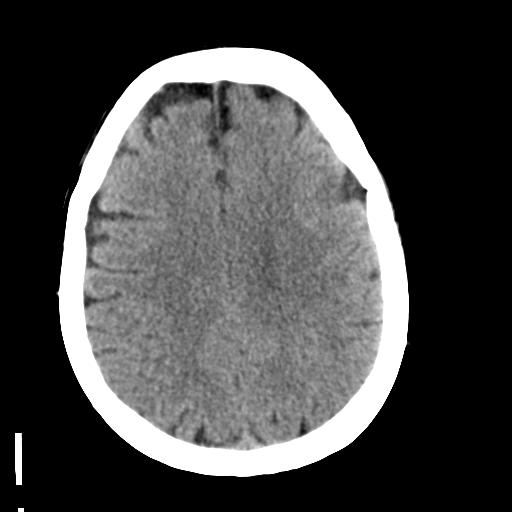
[im 23/31  brain]
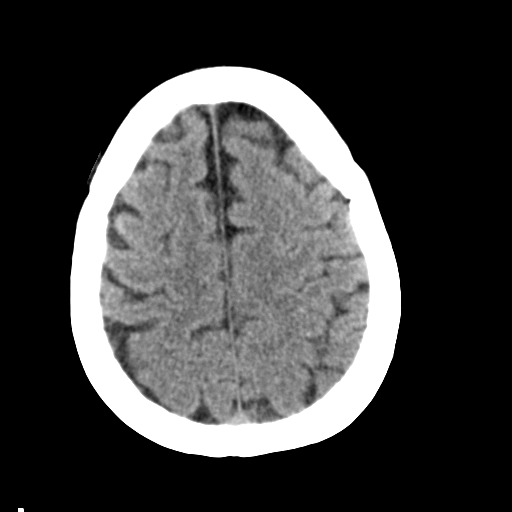
[im 25/31  brain]
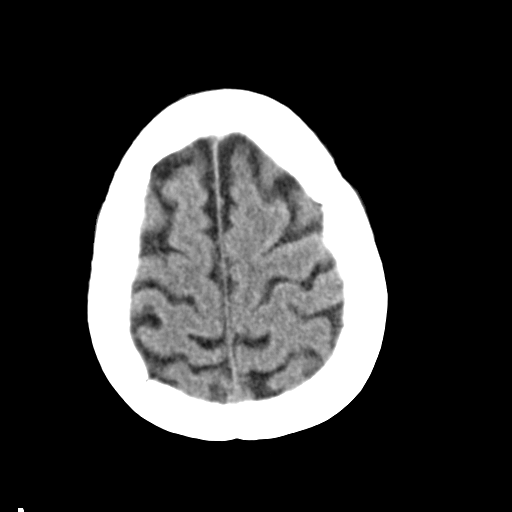
[im 25/31  bone]
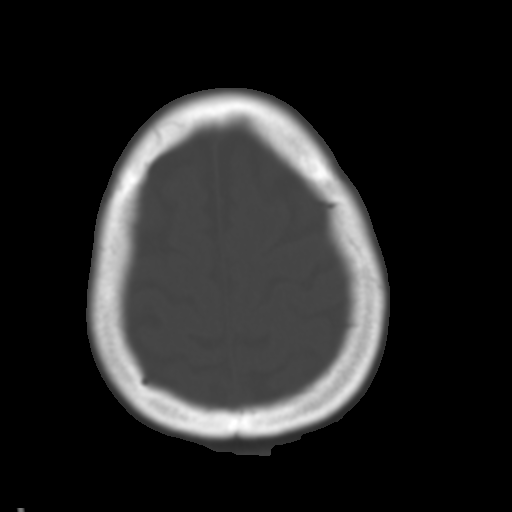
[im 28/31  brain]
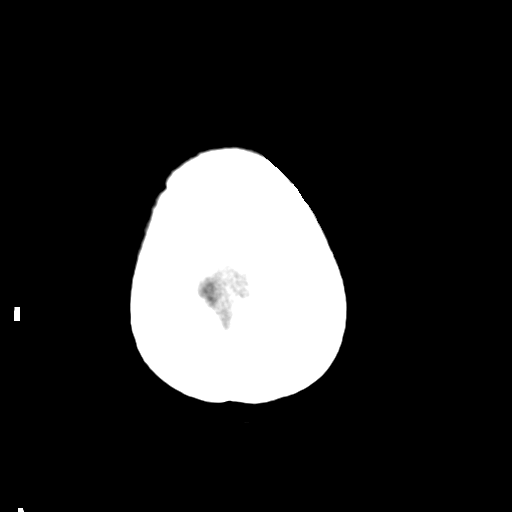

[Series 4: coronal soft tissue · coronal · 0.34mm/px · 3 of 64 slices shown]
[im 22/64  brain]
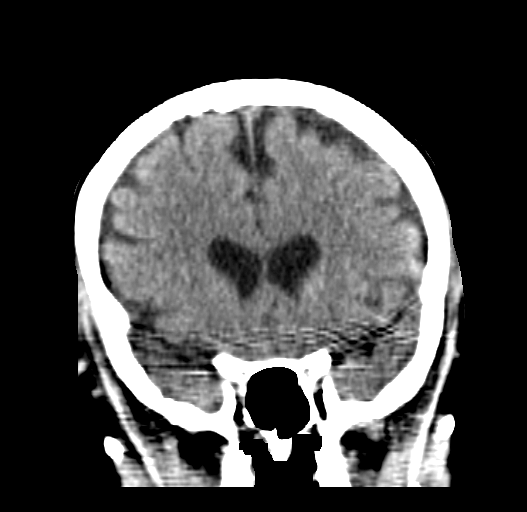
[im 29/64  brain]
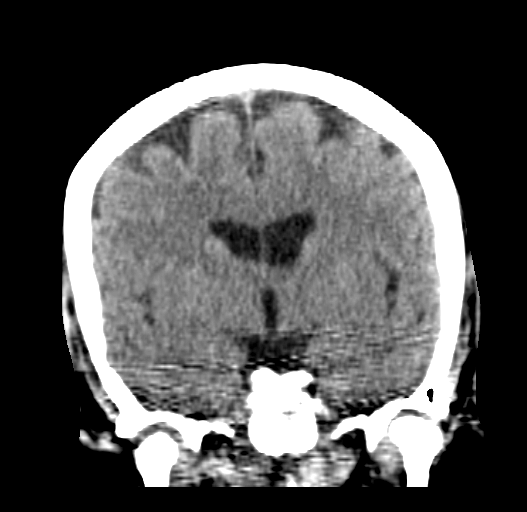
[im 36/64  brain]
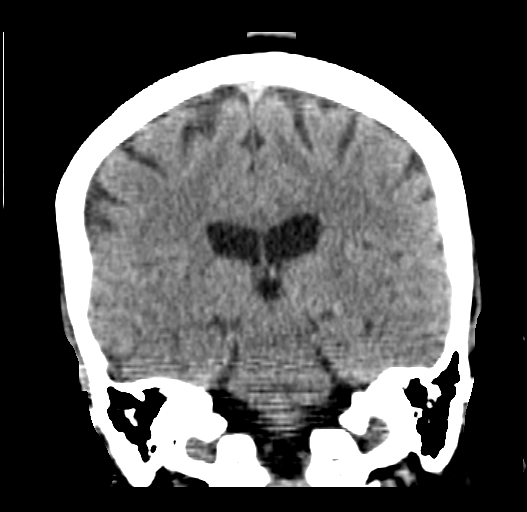

[Series 5: sagittal soft tissue · sagittal · 0.33mm/px · 3 of 49 slices shown]
[im 17/49  brain]
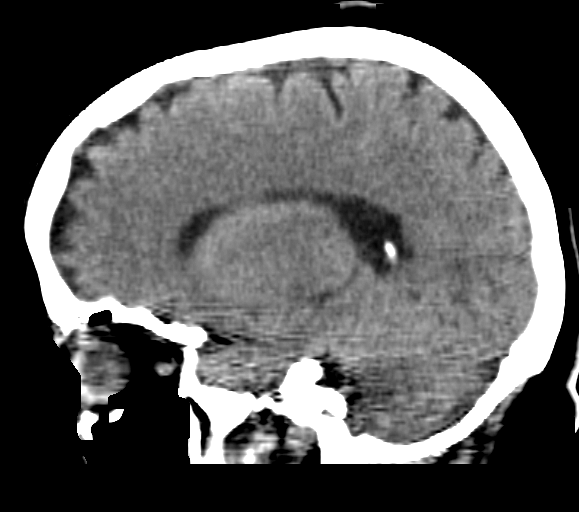
[im 25/49  brain]
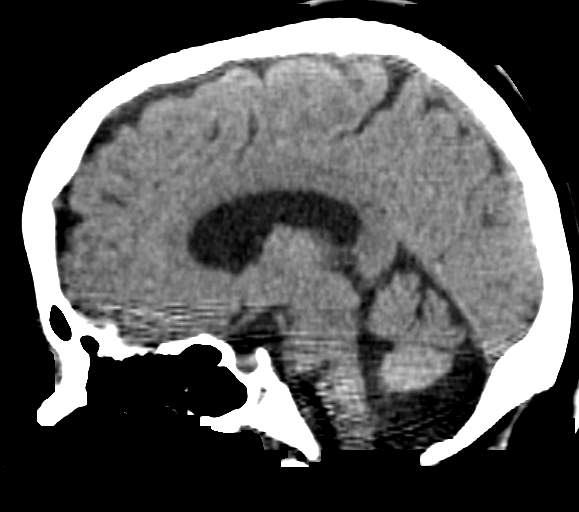
[im 33/49  brain]
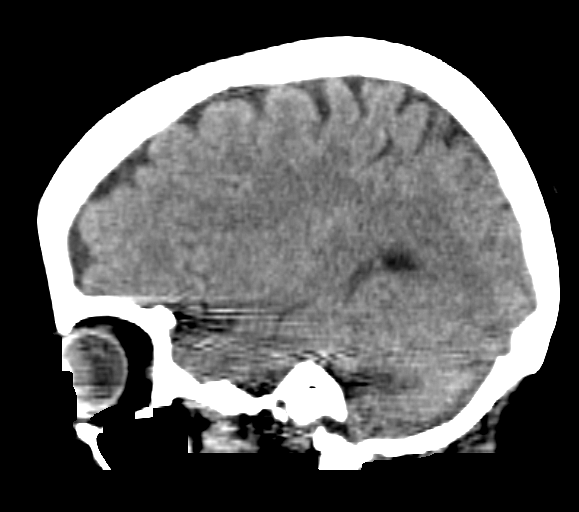

[16 of 47 positions shown; findings below may reference images not displayed]

FINDINGS: Normal ventricular morphology.

No midline shift or mass effect.

Normal appearance of brain parenchyma.

No intracranial hemorrhage, mass lesion or evidence acute
infarction.

No extra-axial fluid collections.

Small amount of dependent fluid within RIGHT maxillary sinus.

Small mucosal retention cyst LEFT maxillary sinus.

Bones and mastoid air cells unremarkable.
IMPRESSION: No acute intracranial abnormalities.

## 2018-10-29 ENCOUNTER — Other Ambulatory Visit: Payer: BLUE CROSS/BLUE SHIELD

## 2018-10-29 ENCOUNTER — Ambulatory Visit: Payer: Self-pay

## 2018-10-29 DIAGNOSIS — Z20822 Contact with and (suspected) exposure to covid-19: Secondary | ICD-10-CM

## 2018-10-29 NOTE — Telephone Encounter (Signed)
Incoming Call from Santiago Glad with [facility] Internal  Office.  Requested Patient be tested for Covid-19.  Telephone call to Pt.  Scheduled for later for today @200pm .  Patient voiced understanding.

## 2018-10-31 LAB — NOVEL CORONAVIRUS, NAA: SARS-CoV-2, NAA: NOT DETECTED

## 2019-02-08 IMAGING — CR DG CHEST 2V
2 series · 2 of 2 positions shown · non-contrast
Comparison: Portable chest x-ray November 18, 2015

CLINICAL DATA: Mid chest pain for the past day. Nonsmoker. No known
cardiopulmonary abnormality.

EXAM:
CHEST  2 VIEW

[chest pa]
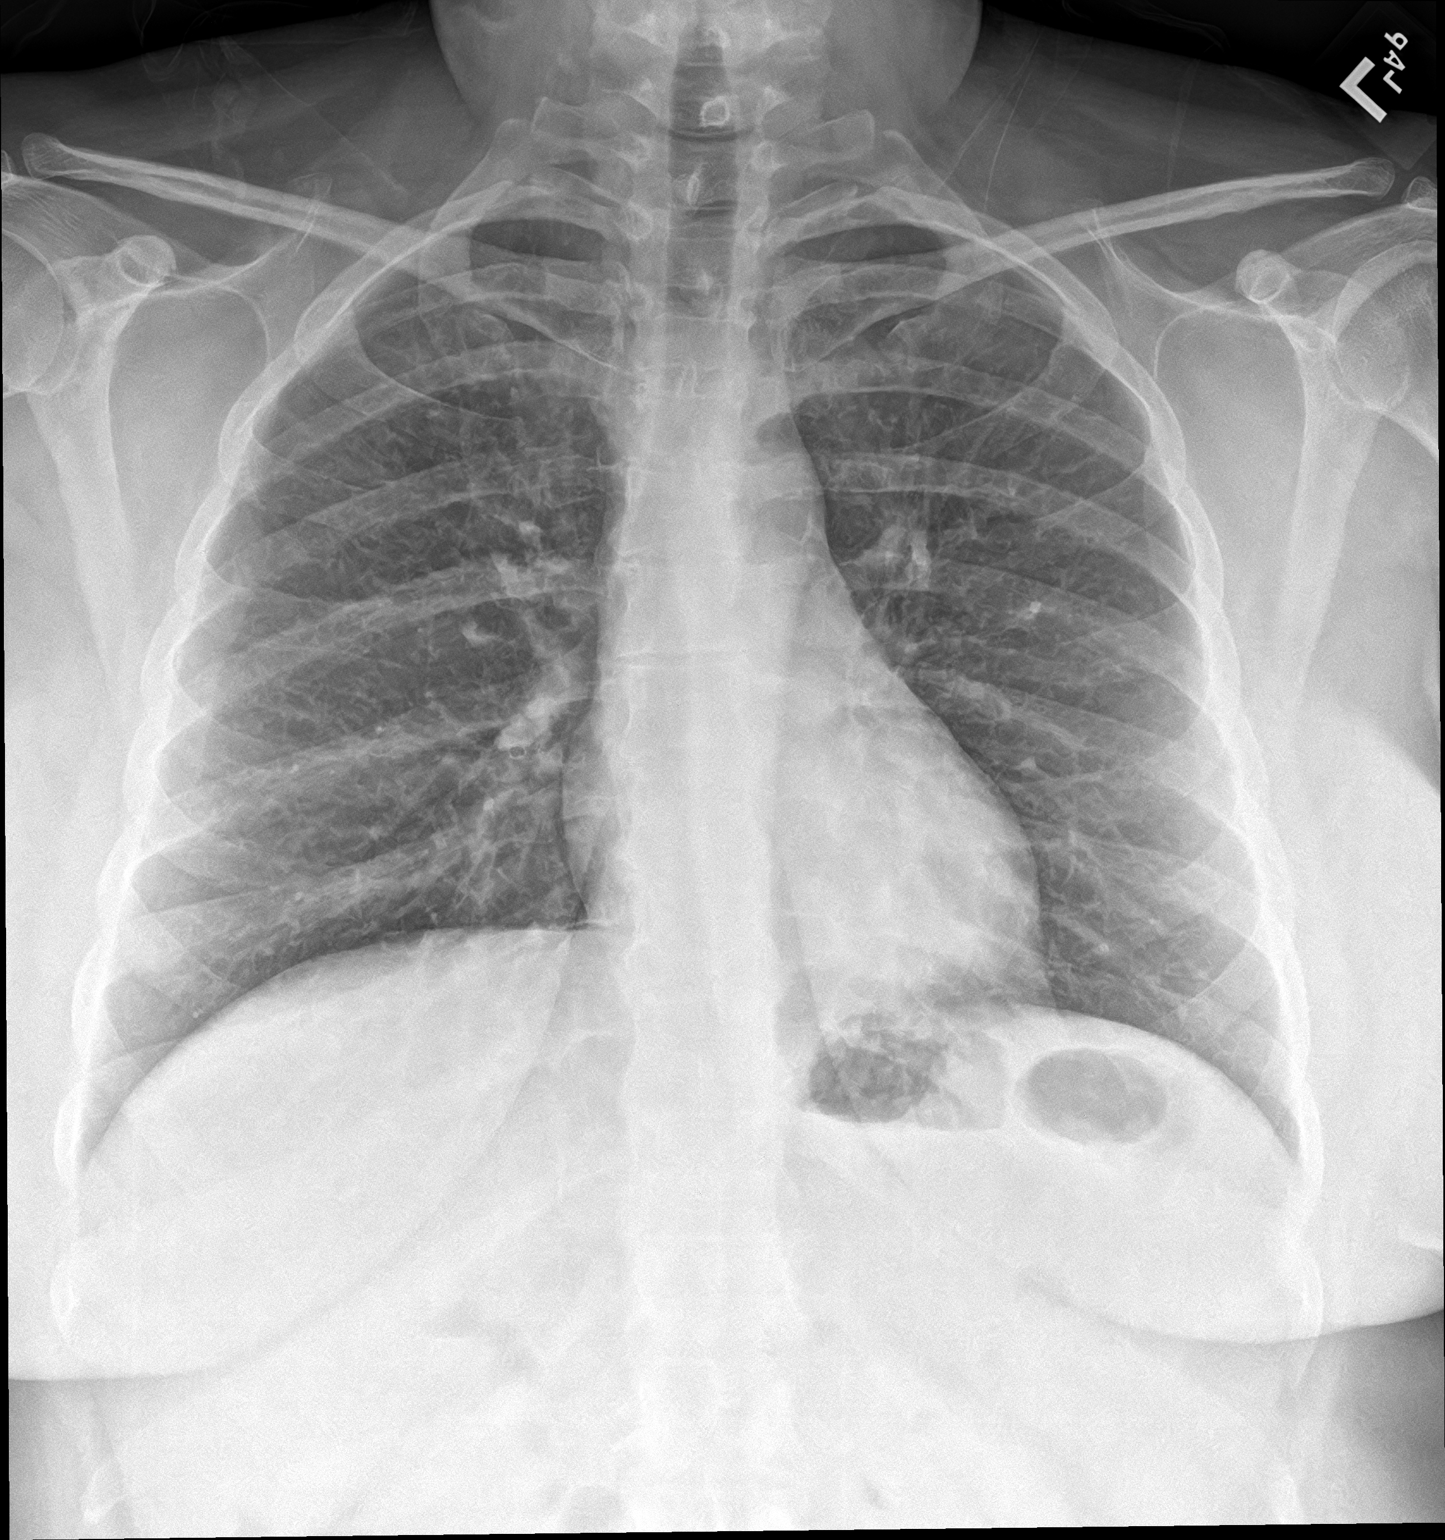

[chest lat]
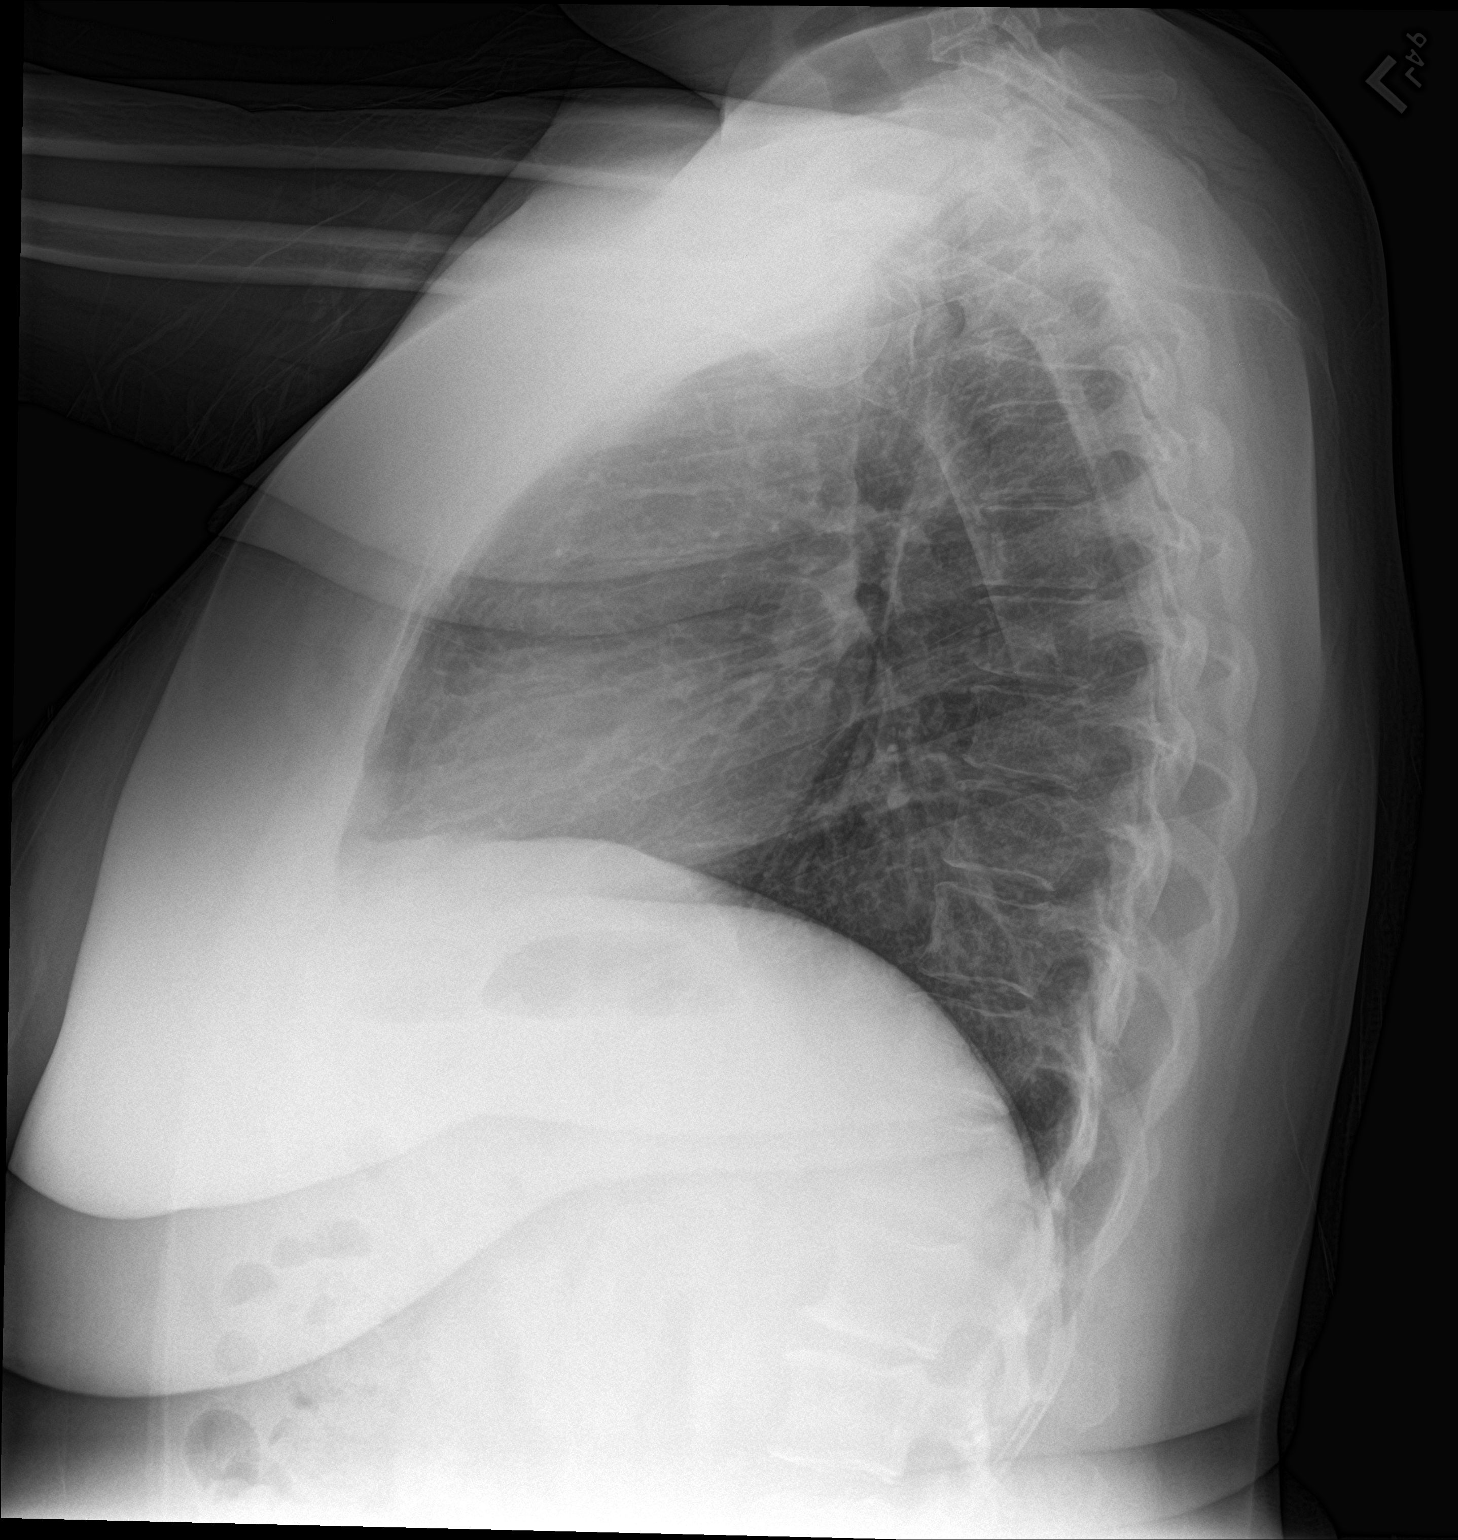

[2 of 2 positions shown; findings below may reference images not displayed]

FINDINGS: The lungs are well-expanded. There is no focal infiltrate. There is
no pleural effusion. The heart and pulmonary vascularity are normal.
The mediastinum is normal in width. There is calcification in the
wall of the aortic arch. The bony thorax is unremarkable.
IMPRESSION: No acute cardiopulmonary abnormality.

Thoracic aortic atherosclerosis.

## 2019-04-23 ENCOUNTER — Other Ambulatory Visit: Payer: Self-pay

## 2019-04-23 DIAGNOSIS — Z20822 Contact with and (suspected) exposure to covid-19: Secondary | ICD-10-CM

## 2019-04-24 LAB — NOVEL CORONAVIRUS, NAA: SARS-CoV-2, NAA: NOT DETECTED

## 2019-06-09 ENCOUNTER — Ambulatory Visit: Payer: Self-pay | Attending: Internal Medicine

## 2019-06-09 DIAGNOSIS — Z20822 Contact with and (suspected) exposure to covid-19: Secondary | ICD-10-CM | POA: Insufficient documentation

## 2019-06-10 LAB — NOVEL CORONAVIRUS, NAA: SARS-CoV-2, NAA: NOT DETECTED

## 2022-03-30 ENCOUNTER — Encounter: Payer: Self-pay | Admitting: Emergency Medicine

## 2022-03-30 ENCOUNTER — Emergency Department: Payer: BC Managed Care – PPO

## 2022-03-30 ENCOUNTER — Emergency Department
Admission: EM | Admit: 2022-03-30 | Discharge: 2022-03-30 | Disposition: A | Discharge status: Home / Self Care | Payer: BC Managed Care – PPO | Attending: Emergency Medicine | Admitting: Emergency Medicine

## 2022-03-30 ENCOUNTER — Other Ambulatory Visit: Payer: Self-pay

## 2022-03-30 DIAGNOSIS — E875 Hyperkalemia: Secondary | ICD-10-CM | POA: Diagnosis not present

## 2022-03-30 DIAGNOSIS — R109 Unspecified abdominal pain: Secondary | ICD-10-CM

## 2022-03-30 DIAGNOSIS — R748 Abnormal levels of other serum enzymes: Secondary | ICD-10-CM | POA: Insufficient documentation

## 2022-03-30 DIAGNOSIS — R1011 Right upper quadrant pain: Secondary | ICD-10-CM | POA: Diagnosis not present

## 2022-03-30 LAB — COMPREHENSIVE METABOLIC PANEL
ALT: 34 U/L (ref 0–44)
AST: 30 U/L (ref 15–41)
Albumin: 4.5 g/dL (ref 3.5–5.0)
Alkaline Phosphatase: 44 U/L (ref 38–126)
Anion gap: 13 (ref 5–15)
BUN: 20 mg/dL (ref 6–20)
CO2: 19 mmol/L — ABNORMAL LOW (ref 22–32)
Calcium: 9.5 mg/dL (ref 8.9–10.3)
Chloride: 106 mmol/L (ref 98–111)
Creatinine, Ser: UNDETERMINED mg/dL (ref 0.44–1.00)
Glucose, Bld: 114 mg/dL — ABNORMAL HIGH (ref 70–99)
Potassium: 5.5 mmol/L — ABNORMAL HIGH (ref 3.5–5.1)
Sodium: 138 mmol/L (ref 135–145)
Total Bilirubin: 0.9 mg/dL (ref 0.3–1.2)
Total Protein: 7.8 g/dL (ref 6.5–8.1)

## 2022-03-30 LAB — URINALYSIS, ROUTINE W REFLEX MICROSCOPIC
Bacteria, UA: NONE SEEN
Bilirubin Urine: NEGATIVE
Glucose, UA: >=500 mg/dL — AB
Ketones, ur: 5 mg/dL — AB
Leukocytes,Ua: NEGATIVE
Nitrite: NEGATIVE
Protein, ur: NEGATIVE mg/dL
Specific Gravity, Urine: 1.017 (ref 1.005–1.030)
pH: 5 (ref 5.0–8.0)

## 2022-03-30 LAB — CBC
HCT: 50.8 % — ABNORMAL HIGH (ref 36.0–46.0)
Hemoglobin: 16.3 g/dL — ABNORMAL HIGH (ref 12.0–15.0)
MCH: 29.6 pg (ref 26.0–34.0)
MCHC: 32.1 g/dL (ref 30.0–36.0)
MCV: 92.4 fL (ref 80.0–100.0)
Platelets: 218 10*3/uL (ref 150–400)
RBC: 5.5 MIL/uL — ABNORMAL HIGH (ref 3.87–5.11)
RDW: 13.4 % (ref 11.5–15.5)
WBC: 9.9 10*3/uL (ref 4.0–10.5)
nRBC: 0 % (ref 0.0–0.2)

## 2022-03-30 LAB — CREATININE, SERUM
Creatinine, Ser: 0.66 mg/dL (ref 0.44–1.00)
GFR, Estimated: >60 mL/min (ref >60)

## 2022-03-30 LAB — LIPASE, BLOOD: Lipase: 64 U/L — ABNORMAL HIGH (ref 11–51)

## 2022-03-30 LAB — PREGNANCY, URINE: Preg Test, Ur: NEGATIVE

## 2022-03-30 MED ORDER — IOHEXOL 300 MG/ML  SOLN
100.0000 mL | Freq: Once | INTRAMUSCULAR | Status: AC | PRN
  Administered 2022-03-30: 100 mL via INTRAVENOUS

## 2022-03-30 MED ORDER — LACTATED RINGERS IV BOLUS
1000.0000 mL | Freq: Once | INTRAVENOUS | Status: AC
  Administered 2022-03-30: 1000 mL via INTRAVENOUS

## 2022-03-30 MED ORDER — ONDANSETRON HCL 4 MG/2ML IJ SOLN
4.0000 mg | Freq: Once | INTRAMUSCULAR | Status: AC
  Administered 2022-03-30: 4 mg via INTRAVENOUS
  Filled 2022-03-30: qty 2

## 2022-03-30 MED ORDER — HYOSCYAMINE SULFATE SL 0.125 MG SL SUBL: 0.1250 mg | SUBLINGUAL_TABLET | Freq: Four times a day (QID) | SUBLINGUAL | 0 refills | Status: AC | PRN

## 2022-03-30 NOTE — ED Triage Notes (Signed)
Pt here from [facility] with abd pain since Tuesday. Pt states pain is right under her breast. Pt states when she is lifting, the pain radiates to her back. Pt also has been vomiting.

## 2022-03-30 NOTE — ED Notes (Signed)
Urine sample given at [facility].

## 2022-03-30 NOTE — ED Provider Notes (Signed)
[facility] Provider Note    Event Date/Time   First MD Initiated Contact with Patient 03/30/22 1324     (approximate)   History   Abdominal Pain   HPI  Cathy Lopez is a 51 y.o. female with history of SI attempt via overdose presents emergency department for treatment and evaluation of right upper quadrant abdominal pain that radiates into her back.  Symptoms started 2 days ago.  Pain increases with movement, cough, and touch.  She is also feeling nauseated but has not vomited.  Low-grade temperature of 99 at home yesterday.  She has taken Aleve without relief.     Physical Exam   Triage Vital Signs: ED Triage Vitals  Enc Vitals Group     BP 03/30/22 1237 (!) 118/54     Pulse Rate 03/30/22 1237 85     Resp 03/30/22 1237 18     Temp 03/30/22 1239 (!) 97.5 F (36.4 C)     Temp Source 03/30/22 1239 Axillary     SpO2 03/30/22 1237 100 %     Weight 03/30/22 1326 190 lb 0.6 oz (86.2 kg)     Height 03/30/22 1326 5' (1.524 m)     Head Circumference --      Peak Flow --      Pain Score 03/30/22 1237 4     Pain Loc --      Pain Edu? --      Excl. in [place]? --     Most recent vital signs: Vitals:   03/30/22 1643 03/30/22 1643  BP: (!) 132/59 (!) 132/59  Pulse: 85 79  Resp: 16 18  Temp: (!) 97.5 F (36.4 C) 98 F (36.7 C)  SpO2: 99% 99%     General: Awake, no distress.  CV:  Good peripheral perfusion.  Resp:  Normal effort.  Abd:  No distention.  Soft, positive McBurney's Other:     ED Results / Procedures / Treatments   Labs (all labs ordered are listed, but only abnormal results are displayed) Labs Reviewed  LIPASE, BLOOD - Abnormal; Notable for the following components:      Result Value   Lipase 64 (*)    All other components within normal limits  COMPREHENSIVE METABOLIC PANEL - Abnormal; Notable for the following components:   Potassium 5.5 (*)    CO2 19 (*)    Glucose, Bld 114 (*)    All other components within normal  limits  CBC - Abnormal; Notable for the following components:   RBC 5.50 (*)    Hemoglobin 16.3 (*)    HCT 50.8 (*)    All other components within normal limits  URINALYSIS, ROUTINE W REFLEX MICROSCOPIC - Abnormal; Notable for the following components:   Color, Urine STRAW (*)    APPearance CLEAR (*)    Glucose, UA >=500 (*)    Hgb urine dipstick SMALL (*)    Ketones, ur 5 (*)    All other components within normal limits  CREATININE, SERUM  PREGNANCY, URINE     EKG  Not indicated   RADIOLOGY     PROCEDURES:  Critical Care performed: No  Procedures   MEDICATIONS ORDERED IN ED: Medications  ondansetron (ZOFRAN) injection 4 mg (4 mg Intravenous Given 03/30/22 1637)  lactated ringers bolus 1,000 mL (0 mLs Intravenous Stopped 03/30/22 1801)  iohexol (OMNIPAQUE) 300 MG/ML solution 100 mL (100 mLs Intravenous Contrast Given 03/30/22 1657)     IMPRESSION / MDM / ASSESSMENT  AND PLAN / ED COURSE  I reviewed the triage vital signs and the nursing notes.                              Differential diagnosis includes, but is not limited to acute cholelithiasis, cholecystitis, musculoskeletal strain, steatohepatitis  Patient's presentation is most consistent with acute illness / injury with system symptoms.  51 year old female presenting to the emergency department for treatment and evaluation of focal right upper quadrant pain that radiates into her back just under her right shoulder blade.  See HPI for further details.  Plan will be to get an ultrasound to rule out acute cholecystitis.  Labs drawn while awaiting ER room assignment show a normal white count.  Remainder of the labs are pending.  Lipase is slightly elevated at 64.  CMP shows a very mild hyperkalemia at 5.5 with a CO2 of 19.  The initial lab sample was not of a sufficient amount to perform the creatinine and GFR.  It was recollected and shows to be creatinine of 0.6 with a GFR of greater than 60.  Urine  pregnancy test is negative.  Urinalysis shows a small amount of hemoglobin and greater than 500 mg/dL of glucose.  Right upper quadrant ultrasound was negative for acute concerns that would explain the patient's abdominal pain.  CT of the abdomen and pelvis was performed which shows no acute findings.  Upon further discussion, her husband recalls that she was placed back on metformin approximately 1 week ago.  This may be the underlying cause for her nausea and abdominal cramping.  Plan will be to treat her biliary colic with Levsin and have her schedule a follow-up appointment with primary care.  If symptoms change or worsen and she is unable to see primary care, she is to return to the emergency department.     FINAL CLINICAL IMPRESSION(S) / ED DIAGNOSES   Final diagnoses:  Acute abdominal pain     Rx / DC Orders   ED Discharge Orders          Ordered    Hyoscyamine Sulfate SL (LEVSIN/SL) 0.125 MG SUBL  Every 6 hours PRN        03/30/22 1758             Note:  This document was prepared using Dragon voice recognition software and may include unintentional dictation errors.   Victorino Dike, FNP 03/30/22 Kathaleen Maser    Nathaniel Man, MD 04/02/22 [phone]

## 2022-06-23 ENCOUNTER — Encounter: Payer: Self-pay | Admitting: Gastroenterology

## 2022-06-25 NOTE — H&P (Signed)
Pre-Procedure H&P   Patient ID: Cathy Lopez is a 52 y.o. female.  Gastroenterology Provider: Annamaria Helling, DO  Referring Provider: Dr. Hall Busing PCP: Albina Billet, MD  Date: 06/26/2022  HPI Cathy Lopez is a 52 y.o. female who presents today for Colonoscopy for Screening colonoscopy .  Patient reports daily bowel movement without melena or hematochezia.  Her last screening colonoscopy was November 2012 which was normal.  Biopsies at that time were negative for microscopic colitis.  She has no family history of colon cancer or colon polyps  Underwent CT with contrast which was negative for GI abnormality.  Ultrasound demonstrated fatty liver disease  Also underwent EGD in 2012 which was negative.  Biopsies at that time were negative for Barrett's esophagus   Past Medical History:  Diagnosis Date   Diabetes mellitus without complication ([place])    Hypercholesteremia    Hypertension    Sepsis ([place])    Suicide attempt [facility])     Past Surgical History:  Procedure Laterality Date   COLONOSCOPY WITH PROPOFOL     TUBAL LIGATION      Family History No h/o GI disease or malignancy  Review of Systems  Constitutional:  Negative for activity change, appetite change, chills, diaphoresis, fatigue, fever and unexpected weight change.  HENT:  Negative for trouble swallowing and voice change.   Respiratory:  Negative for shortness of breath and wheezing.   Cardiovascular:  Negative for chest pain, palpitations and leg swelling.  Gastrointestinal:  Negative for abdominal distention, abdominal pain, anal bleeding, blood in stool, constipation, diarrhea, nausea, rectal pain and vomiting.  Musculoskeletal:  Negative for arthralgias and myalgias.  Skin:  Negative for color change and pallor.  Neurological:  Negative for dizziness, syncope and weakness.  Psychiatric/Behavioral:  Negative for confusion.   All other systems reviewed and are negative.    Medications No  current facility-administered medications on file prior to encounter.   Current Outpatient Medications on File Prior to Encounter  Medication Sig Dispense Refill   Hyoscyamine Sulfate SL (LEVSIN/SL) 0.125 MG SUBL Place 0.125 mg under the tongue every 6 (six) hours as needed. 120 tablet 0   Melatonin 5 MG TABS Take 5 mg by mouth at bedtime as needed.     Multiple Vitamin (MULTIVITAMIN) tablet Take 1 tablet by mouth daily.     oxyCODONE-acetaminophen (ROXICET) 5-325 MG tablet Take 1 tablet by mouth every 6 (six) hours as needed. (Patient not taking: Reported on 10/25/2016) 12 tablet 0   sertraline (ZOLOFT) 50 MG tablet Take 50 mg by mouth daily. (Patient not taking: Reported on 06/26/2022)  1    Pertinent medications related to GI and procedure were reviewed by me with the patient prior to the procedure   Current Facility-Administered Medications:    0.9 %  sodium chloride infusion, , Intravenous, Continuous, Annamaria Helling, DO      No Known Allergies Allergies were reviewed by me prior to the procedure  Objective   Body mass index is 36.31 kg/m. Vitals:   06/26/22 0921 06/26/22 0933  BP:  133/89  Pulse:  91  Resp:  18  Temp:  (!) 97.3 F (36.3 C)  TempSrc:  Temporal  SpO2:  98%  Weight: 93 kg   Height: 5' 3"$  (1.6 m)      Physical Exam Vitals and nursing note reviewed.  Constitutional:      General: She is not in acute distress.    Appearance: Normal appearance. She is  obese. She is not ill-appearing, toxic-appearing or diaphoretic.  HENT:     Head: Normocephalic and atraumatic.     Nose: Nose normal.     Mouth/Throat:     Mouth: Mucous membranes are moist.     Pharynx: Oropharynx is clear.  Eyes:     General: No scleral icterus. Cardiovascular:     Rate and Rhythm: Normal rate and regular rhythm.     Heart sounds: Normal heart sounds. No murmur heard.    No friction rub. No gallop.  Pulmonary:     Effort: Pulmonary effort is normal. No respiratory  distress.     Breath sounds: Normal breath sounds. No wheezing, rhonchi or rales.  Abdominal:     General: Bowel sounds are normal. There is no distension.     Palpations: Abdomen is soft.     Tenderness: There is no abdominal tenderness. There is no guarding or rebound.  Musculoskeletal:     Cervical back: Neck supple.     Right lower leg: No edema.     Left lower leg: No edema.  Skin:    General: Skin is warm and dry.     Coloration: Skin is not jaundiced or pale.  Neurological:     General: No focal deficit present.     Mental Status: She is alert and oriented to person, place, and time. Mental status is at baseline.  Psychiatric:        Mood and Affect: Mood normal.        Behavior: Behavior normal.        Thought Content: Thought content normal.        Judgment: Judgment normal.      Assessment:  Cathy Lopez is a 52 y.o. female  who presents today for Colonoscopy for Screening colonoscopy .  Plan:  Colonoscopy with possible intervention today  Colonoscopy with possible biopsy, control of bleeding, polypectomy, and interventions as necessary has been discussed with the patient/patient representative. Informed consent was obtained from the patient/patient representative after explaining the indication, nature, and risks of the procedure including but not limited to death, bleeding, perforation, missed neoplasm/lesions, cardiorespiratory compromise, and reaction to medications. Opportunity for questions was given and appropriate answers were provided. Patient/patient representative has verbalized understanding is amenable to undergoing the procedure.   Annamaria Helling, DO  [facility]  Portions of the record may have been created with voice recognition software. Occasional wrong-word or 'sound-a-like' substitutions may have occurred due to the inherent limitations of voice recognition software.  Read the chart carefully and recognize, using  context, where substitutions may have occurred.

## 2022-06-26 ENCOUNTER — Ambulatory Visit: Payer: BC Managed Care – PPO | Admitting: Certified Registered"

## 2022-06-26 ENCOUNTER — Ambulatory Visit
Admission: RE | Admit: 2022-06-26 | Discharge: 2022-06-26 | Disposition: A | Discharge status: Home / Self Care | Payer: BC Managed Care – PPO | Attending: Gastroenterology | Admitting: Gastroenterology

## 2022-06-26 ENCOUNTER — Encounter
Admission: RE | Disposition: A | Discharge status: Home / Self Care | Payer: Self-pay | Source: Home / Self Care | Attending: Gastroenterology

## 2022-06-26 ENCOUNTER — Encounter: Payer: Self-pay | Admitting: Gastroenterology

## 2022-06-26 DIAGNOSIS — K621 Rectal polyp: Secondary | ICD-10-CM | POA: Insufficient documentation

## 2022-06-26 DIAGNOSIS — K640 First degree hemorrhoids: Secondary | ICD-10-CM | POA: Insufficient documentation

## 2022-06-26 DIAGNOSIS — D128 Benign neoplasm of rectum: Secondary | ICD-10-CM | POA: Insufficient documentation

## 2022-06-26 DIAGNOSIS — K635 Polyp of colon: Secondary | ICD-10-CM | POA: Diagnosis not present

## 2022-06-26 DIAGNOSIS — E119 Type 2 diabetes mellitus without complications: Secondary | ICD-10-CM | POA: Diagnosis not present

## 2022-06-26 DIAGNOSIS — Z1211 Encounter for screening for malignant neoplasm of colon: Secondary | ICD-10-CM | POA: Insufficient documentation

## 2022-06-26 DIAGNOSIS — K760 Fatty (change of) liver, not elsewhere classified: Secondary | ICD-10-CM | POA: Diagnosis not present

## 2022-06-26 DIAGNOSIS — D123 Benign neoplasm of transverse colon: Secondary | ICD-10-CM | POA: Insufficient documentation

## 2022-06-26 HISTORY — DX: Essential (primary) hypertension: I10

## 2022-06-26 HISTORY — DX: Sepsis, unspecified organism: A41.9

## 2022-06-26 HISTORY — DX: Type 2 diabetes mellitus without complications: E11.9

## 2022-06-26 HISTORY — DX: Pure hypercholesterolemia, unspecified: E78.00

## 2022-06-26 HISTORY — PX: COLONOSCOPY: SHX5424

## 2022-06-26 LAB — GLUCOSE, CAPILLARY: Glucose-Capillary: 111 mg/dL — ABNORMAL HIGH (ref 70–99)

## 2022-06-26 SURGERY — COLONOSCOPY
Anesthesia: General

## 2022-06-26 MED ORDER — SODIUM CHLORIDE 0.9 % IV SOLN: INTRAVENOUS | Status: DC

## 2022-06-26 MED ORDER — LIDOCAINE HCL (CARDIAC) PF 100 MG/5ML IV SOSY
PREFILLED_SYRINGE | INTRAVENOUS | Status: DC | PRN
  Administered 2022-06-26: 100 mg via INTRAVENOUS

## 2022-06-26 MED ORDER — PROPOFOL 500 MG/50ML IV EMUL
INTRAVENOUS | Status: DC | PRN
  Administered 2022-06-26: 50 mg via INTRAVENOUS
  Administered 2022-06-26: 125 ug/kg/min via INTRAVENOUS

## 2022-06-26 NOTE — Interval H&P Note (Signed)
History and Physical Interval Note: Preprocedure H&P from 06/26/22  was reviewed and there was no interval change after seeing and examining the patient.  Written consent was obtained from the patient after discussion of risks, benefits, and alternatives. Patient has consented to proceed with Colonoscopy with possible intervention   06/26/2022 9:49 AM  Cathy Lopez  has presented today for surgery, with the diagnosis of Colon cancer screening (Z12.11).  The various methods of treatment have been discussed with the patient and family. After consideration of risks, benefits and other options for treatment, the patient has consented to  Procedure(s): COLONOSCOPY (N/A) as a surgical intervention.  The patient's history has been reviewed, patient examined, no change in status, stable for surgery.  I have reviewed the patient's chart and labs.  Questions were answered to the patient's satisfaction.     Annamaria Helling

## 2022-06-26 NOTE — Op Note (Signed)
[facility] Patient Name: Cathy Lopez Procedure Date: 06/26/2022 9:59 AM MRN: [id number] Account #: [id number] Date of Birth: Feb 09, 1971 Admit Type: Outpatient Age: 52 Room: [facility] ENDO ROOM 2 Gender: Female Note Status: Finalized Instrument Name: Colonscope [id number] Procedure:             Colonoscopy Indications:           Screening for colorectal malignant neoplasm Providers:             Annamaria Helling DO, DO Medicines:             Monitored Anesthesia Care Complications:         No immediate complications. Estimated blood loss:                         Minimal. Procedure:             Pre-Anesthesia Assessment:                        - Prior to the procedure, a History and Physical was                         performed, and patient medications and allergies were                         reviewed. The patient is competent. The risks and                         benefits of the procedure and the sedation options and                         risks were discussed with the patient. All questions                         were answered and informed consent was obtained.                         Patient identification and proposed procedure were                         verified by the physician, the nurse, the anesthetist                         and the technician in the endoscopy suite. Mental                         Status Examination: alert and oriented. Airway                         Examination: normal oropharyngeal airway and neck                         mobility. Respiratory Examination: clear to                         auscultation. CV Examination: RRR, no murmurs, no S3                         or S4. Prophylactic Antibiotics: The patient does not  require prophylactic antibiotics. Prior                         Anticoagulants: The patient has taken no anticoagulant                         or antiplatelet agents. ASA Grade  Assessment: II - A                         patient with mild systemic disease. After reviewing                         the risks and benefits, the patient was deemed in                         satisfactory condition to undergo the procedure. The                         anesthesia plan was to use monitored anesthesia care                         (MAC). Immediately prior to administration of                         medications, the patient was re-assessed for adequacy                         to receive sedatives. The heart rate, respiratory                         rate, oxygen saturations, blood pressure, adequacy of                         pulmonary ventilation, and response to care were                         monitored throughout the procedure. The physical                         status of the patient was re-assessed after the                         procedure.                        After obtaining informed consent, the colonoscope was                         passed under direct vision. Throughout the procedure,                         the patient's blood pressure, pulse, and oxygen                         saturations were monitored continuously. The                         Colonoscope was introduced through the anus and  advanced to the the cecum, identified by appendiceal                         orifice and ileocecal valve. The colonoscopy was                         performed without difficulty. The patient tolerated                         the procedure well. The quality of the bowel                         preparation was evaluated using the BBPS [facility] Bowel                         Preparation Scale) with scores of: Right Colon = 3,                         Transverse Colon = 3 and Left Colon = 3 (entire mucosa                         seen well with no residual staining, small fragments                         of stool or opaque liquid). The total BBPS score                          equals 9. The ileocecal valve, appendiceal orifice,                         and rectum were photographed. Findings:      The perianal and digital rectal examinations were normal. Pertinent       negatives include normal sphincter tone.      Retroflexion in the right colon was performed.      Non-bleeding internal hemorrhoids were found during retroflexion. The       hemorrhoids were Grade I (internal hemorrhoids that do not prolapse).       Estimated blood loss: none.      Four sessile polyps were found in the rectum (2), sigmoid colon (1) and       transverse colon (1). The polyps were 1 to 2 mm in size. These polyps       were removed with a jumbo cold forceps. Resection and retrieval were       complete. Estimated blood loss was minimal.      Two sessile polyps were found in the rectum and transverse colon. The       polyps were 3 to 5 mm in size. These polyps were removed with a cold       snare. Resection and retrieval were complete. Estimated blood loss was       minimal.      The exam was otherwise without abnormality on direct and retroflexion       views. Impression:            - Non-bleeding internal hemorrhoids.                        - Four 1 to 2 mm polyps in  the rectum, in the sigmoid                         colon and in the transverse colon, removed with a                         jumbo cold forceps. Resected and retrieved.                        - Two 3 to 5 mm polyps in the rectum and in the                         transverse colon, removed with a cold snare. Resected                         and retrieved.                        - The examination was otherwise normal on direct and                         retroflexion views. Recommendation:        - Patient has a contact number available for                         emergencies. The signs and symptoms of potential                         delayed complications were discussed with the patient.                          Return to normal activities tomorrow. Written                         discharge instructions were provided to the patient.                        - Discharge patient to home.                        - Resume previous diet.                        - Continue present medications.                        - Await pathology results.                        - Repeat colonoscopy for surveillance based on                         pathology results.                        - Return to referring physician as previously                         scheduled.                        -  The findings and recommendations were discussed with                         the patient. Procedure Code(s):     --- Professional ---                        [phone], Colonoscopy, flexible; with removal of                         tumor(s), polyp(s), or other lesion(s) by snare                         technique                        [id number], 86, Colonoscopy, flexible; with biopsy, single                         or multiple Diagnosis Code(s):     --- Professional ---                        Z12.11, Encounter for screening for malignant neoplasm                         of colon                        D12.5, Benign neoplasm of sigmoid colon                        D12.8, Benign neoplasm of rectum                        D12.3, Benign neoplasm of transverse colon (hepatic                         flexure or splenic flexure)                        K64.0, First degree hemorrhoids CPT copyright 2022 [employer]. All rights reserved. The codes documented in this report are preliminary and upon coder review may  be revised to meet current compliance requirements. Attending Participation:      I personally performed the entire procedure. Volney American, DO Annamaria Helling DO, DO 06/26/2022 37:85:88 AM This report has been signed electronically. Number of Addenda: 0 Note Initiated On: 06/26/2022 9:59  AM Scope Withdrawal Time: 0 hours 15 minutes 59 seconds  Total Procedure Duration: 0 hours 20 minutes 56 seconds  Estimated Blood Loss:  Estimated blood loss was minimal.      [facility]

## 2022-06-26 NOTE — Transfer of Care (Signed)
Immediate Anesthesia Transfer of Care Note  Patient: Cathy Lopez  Procedure(s) Performed: COLONOSCOPY  Patient Location: PACU  Anesthesia Type:General  Level of Consciousness: awake, alert , patient cooperative, and responds to stimulation  Airway & Oxygen Therapy: Patient Spontanous Breathing  Post-op Assessment: Report given to RN and Post -op Vital signs reviewed and stable  Post vital signs: Reviewed and stable  Last Vitals:  Vitals Value Taken Time  BP    Temp    Pulse    Resp    SpO2      Last Pain:  Vitals:   06/26/22 0933  TempSrc: Temporal         Complications: No notable events documented.

## 2022-06-26 NOTE — Anesthesia Preprocedure Evaluation (Addendum)
Anesthesia Evaluation  Patient identified by MRN, date of birth, ID band Patient awake    Reviewed: Allergy & Precautions, H&P , NPO status , Patient's Chart, lab work & pertinent test results  Airway Mallampati: III  TM Distance: >3 FB Neck ROM: full    Dental no notable dental hx.    Pulmonary neg pulmonary ROS   Pulmonary exam normal        Cardiovascular Exercise Tolerance: Good hypertension, Pt. on medications Normal cardiovascular exam     Neuro/Psych  PSYCHIATRIC DISORDERS      negative neurological ROS     GI/Hepatic negative GI ROS, Neg liver ROS,,,  Endo/Other  diabetes, Type 2    Renal/GU negative Renal ROS  negative genitourinary   Musculoskeletal   Abdominal  (+) + obese  Peds  Hematology negative hematology ROS (+)   Anesthesia Other Findings Past Medical History: No date: Diabetes mellitus without complication (HCC) No date: Hypercholesteremia No date: Hypertension No date: Sepsis ([place]) No date: Suicide attempt [facility])  Past Surgical History: No date: COLONOSCOPY WITH PROPOFOL No date: TUBAL LIGATION  BMI    Body Mass Index: 36.31 kg/m      Reproductive/Obstetrics negative OB ROS                             Anesthesia Physical Anesthesia Plan  ASA: 2  Anesthesia Plan: General   Post-op Pain Management: Minimal or no pain anticipated   Induction: Intravenous  PONV Risk Score and Plan: Propofol infusion and TIVA  Airway Management Planned: Natural Airway  Additional Equipment:   Intra-op Plan:   Post-operative Plan:   Informed Consent: I have reviewed the patients History and Physical, chart, labs and discussed the procedure including the risks, benefits and alternatives for the proposed anesthesia with the patient or authorized representative who has indicated his/her understanding and acceptance.     Dental Advisory Given  Plan Discussed with:  CRNA and Surgeon  Anesthesia Plan Comments:         Anesthesia Quick Evaluation

## 2022-06-26 NOTE — Anesthesia Postprocedure Evaluation (Signed)
Anesthesia Post Note  Patient: Cathy Lopez  Procedure(s) Performed: COLONOSCOPY  Patient location during evaluation: PACU Anesthesia Type: General Level of consciousness: awake and alert Pain management: pain level controlled Vital Signs Assessment: post-procedure vital signs reviewed and stable Respiratory status: spontaneous breathing, nonlabored ventilation and respiratory function stable Cardiovascular status: blood pressure returned to baseline and stable Postop Assessment: no apparent nausea or vomiting Anesthetic complications: no   There were no known notable events for this encounter.   Last Vitals:  Vitals:   06/26/22 1028 06/26/22 1048  BP: (!) 124/50 130/74  Pulse: 90   Resp: 16   Temp: (!) 36.3 C   SpO2: 97%     Last Pain:  Vitals:   06/26/22 1048  TempSrc:   PainSc: 0-No pain                 [place] Ouch

## 2022-06-27 ENCOUNTER — Encounter: Payer: Self-pay | Admitting: Gastroenterology

## 2022-06-27 LAB — SURGICAL PATHOLOGY
